# Patient Record
Sex: Female | Born: 1935 | Race: White | Hispanic: No | Smoking: Never smoker
Health system: Southern US, Community
[De-identification: ages and names within clinical notes are randomized; demographics above are authoritative.]

## PROBLEM LIST (undated history)

## (undated) DIAGNOSIS — G510 Bell's palsy: Secondary | ICD-10-CM

## (undated) DIAGNOSIS — H3530 Unspecified macular degeneration: Secondary | ICD-10-CM

## (undated) DIAGNOSIS — G629 Polyneuropathy, unspecified: Secondary | ICD-10-CM

## (undated) DIAGNOSIS — F419 Anxiety disorder, unspecified: Secondary | ICD-10-CM

## (undated) DIAGNOSIS — Z9622 Myringotomy tube(s) status: Secondary | ICD-10-CM

## (undated) DIAGNOSIS — E785 Hyperlipidemia, unspecified: Secondary | ICD-10-CM

## (undated) DIAGNOSIS — I10 Essential (primary) hypertension: Secondary | ICD-10-CM

## (undated) DIAGNOSIS — C4359 Malignant melanoma of other part of trunk: Secondary | ICD-10-CM

## (undated) DIAGNOSIS — J449 Chronic obstructive pulmonary disease, unspecified: Secondary | ICD-10-CM

## (undated) HISTORY — PX: CORNEAL TRANSPLANT: SHX108

## (undated) HISTORY — DX: Malignant melanoma of other part of trunk: C43.59

## (undated) HISTORY — DX: Unspecified macular degeneration: H35.30

## (undated) HISTORY — DX: Myringotomy tube(s) status: Z96.22

## (undated) HISTORY — DX: Essential (primary) hypertension: I10

## (undated) HISTORY — PX: APPENDECTOMY: SHX54

## (undated) HISTORY — DX: Polyneuropathy, unspecified: G62.9

## (undated) HISTORY — DX: Chronic obstructive pulmonary disease, unspecified: J44.9

## (undated) HISTORY — PX: MYRINGOTOMY: SUR874

## (undated) HISTORY — PX: LUMBAR DISC SURGERY: SHX700

## (undated) HISTORY — DX: Hyperlipidemia, unspecified: E78.5

## (undated) HISTORY — DX: Anxiety disorder, unspecified: F41.9

## (undated) HISTORY — PX: TOTAL KNEE ARTHROPLASTY: SHX125

## (undated) HISTORY — DX: Bell's palsy: G51.0

---

## 2004-07-18 ENCOUNTER — Ambulatory Visit: Payer: Self-pay | Admitting: Family Medicine

## 2004-08-01 ENCOUNTER — Ambulatory Visit: Payer: Self-pay | Admitting: Family Medicine

## 2004-08-08 ENCOUNTER — Ambulatory Visit: Payer: Self-pay | Admitting: Family Medicine

## 2004-08-15 ENCOUNTER — Ambulatory Visit: Payer: Self-pay | Admitting: Family Medicine

## 2004-08-27 ENCOUNTER — Emergency Department: Payer: Self-pay | Admitting: Emergency Medicine

## 2004-08-29 ENCOUNTER — Ambulatory Visit: Payer: Self-pay | Admitting: Family Medicine

## 2004-09-11 ENCOUNTER — Ambulatory Visit: Payer: Self-pay | Admitting: Family Medicine

## 2004-10-03 ENCOUNTER — Ambulatory Visit: Payer: Self-pay | Admitting: Family Medicine

## 2005-01-09 ENCOUNTER — Ambulatory Visit: Payer: Self-pay | Admitting: Family Medicine

## 2005-06-05 ENCOUNTER — Ambulatory Visit: Payer: Self-pay | Admitting: Family Medicine

## 2005-12-03 ENCOUNTER — Ambulatory Visit: Payer: Self-pay | Admitting: Family Medicine

## 2006-07-08 ENCOUNTER — Ambulatory Visit: Payer: Self-pay | Admitting: Family Medicine

## 2006-07-08 LAB — CONVERTED CEMR LAB
Basophils Relative: 0.3 % (ref 0.0–1.0)
Direct LDL: 169.1 mg/dL
Eosinophils Relative: 1.7 % (ref 0.0–5.0)
Folate: >20 ng/mL
HDL: 44.9 mg/dL (ref >39.0)
Hgb A1c MFr Bld: 5.9 % (ref 4.6–6.0)
Lymphocytes Relative: 24.7 % (ref 12.0–46.0)
Platelets: 368 10*3/uL (ref 150–400)
RBC: 3.91 M/uL (ref 3.87–5.11)
RDW: 12.8 % (ref 11.5–14.6)
TSH: 2.84 microintl units/mL (ref 0.35–5.50)
Total CHOL/HDL Ratio: 5.4
Triglycerides: 170 mg/dL — ABNORMAL HIGH (ref 0–149)
VLDL: 34 mg/dL (ref 0–40)
Vitamin B-12: 295 pg/mL (ref 211–911)
WBC: 7.8 10*3/uL (ref 4.5–10.5)

## 2006-08-10 ENCOUNTER — Ambulatory Visit: Payer: Self-pay | Admitting: Family Medicine

## 2006-10-11 ENCOUNTER — Ambulatory Visit: Payer: Self-pay | Admitting: Family Medicine

## 2006-10-11 DIAGNOSIS — E119 Type 2 diabetes mellitus without complications: Secondary | ICD-10-CM | POA: Insufficient documentation

## 2006-10-12 LAB — CONVERTED CEMR LAB
ALT: 11 units/L (ref 0–40)
Hgb A1c MFr Bld: 6 % (ref 4.6–6.0)
VLDL: 27 mg/dL (ref 0–40)

## 2007-06-16 ENCOUNTER — Ambulatory Visit: Payer: Self-pay | Admitting: Family Medicine

## 2007-06-16 DIAGNOSIS — D1739 Benign lipomatous neoplasm of skin and subcutaneous tissue of other sites: Secondary | ICD-10-CM

## 2007-06-16 LAB — CONVERTED CEMR LAB
Albumin: 4 g/dL (ref 3.5–5.2)
Alkaline Phosphatase: 58 units/L (ref 39–117)
BUN: 22 mg/dL (ref 6–23)
Basophils Absolute: 0.1 10*3/uL (ref 0.0–0.1)
CO2: 26 meq/L (ref 19–32)
Calcium: 9.6 mg/dL (ref 8.4–10.5)
Creatinine, Ser: 1.3 mg/dL — ABNORMAL HIGH (ref 0.4–1.2)
Eosinophils Relative: 1.7 % (ref 0.0–5.0)
Folate: 14.5 ng/mL
Glucose, Bld: 119 mg/dL — ABNORMAL HIGH (ref 70–99)
HCT: 36.1 % (ref 36.0–46.0)
Hgb A1c MFr Bld: 6.1 % — ABNORMAL HIGH (ref 4.6–6.0)
MCHC: 34.5 g/dL (ref 30.0–36.0)
Neutrophils Relative %: 59.7 % (ref 43.0–77.0)
Platelets: 344 10*3/uL (ref 150–400)
Potassium: 5 meq/L (ref 3.5–5.1)
RBC: 4.05 M/uL (ref 3.87–5.11)
RDW: 12.4 % (ref 11.5–14.6)
TSH: 2.37 microintl units/mL (ref 0.35–5.50)
Total Bilirubin: 0.8 mg/dL (ref 0.3–1.2)
WBC: 6.6 10*3/uL (ref 4.5–10.5)

## 2007-06-20 ENCOUNTER — Encounter (INDEPENDENT_AMBULATORY_CARE_PROVIDER_SITE_OTHER): Payer: Self-pay | Admitting: *Deleted

## 2007-06-20 LAB — CONVERTED CEMR LAB: Vit D, 1,25-Dihydroxy: 17 — ABNORMAL LOW (ref 30–89)

## 2007-07-06 ENCOUNTER — Telehealth: Payer: Self-pay | Admitting: Family Medicine

## 2007-07-13 DIAGNOSIS — J4489 Other specified chronic obstructive pulmonary disease: Secondary | ICD-10-CM | POA: Insufficient documentation

## 2007-07-13 DIAGNOSIS — N3946 Mixed incontinence: Secondary | ICD-10-CM | POA: Insufficient documentation

## 2007-07-13 DIAGNOSIS — G589 Mononeuropathy, unspecified: Secondary | ICD-10-CM | POA: Insufficient documentation

## 2007-07-13 DIAGNOSIS — N318 Other neuromuscular dysfunction of bladder: Secondary | ICD-10-CM | POA: Insufficient documentation

## 2007-07-13 DIAGNOSIS — M1610 Unilateral primary osteoarthritis, unspecified hip: Secondary | ICD-10-CM | POA: Insufficient documentation

## 2007-07-13 DIAGNOSIS — I10 Essential (primary) hypertension: Secondary | ICD-10-CM | POA: Insufficient documentation

## 2007-07-13 DIAGNOSIS — J449 Chronic obstructive pulmonary disease, unspecified: Secondary | ICD-10-CM

## 2007-07-13 DIAGNOSIS — M169 Osteoarthritis of hip, unspecified: Secondary | ICD-10-CM | POA: Insufficient documentation

## 2007-07-13 DIAGNOSIS — R011 Cardiac murmur, unspecified: Secondary | ICD-10-CM

## 2007-07-13 DIAGNOSIS — C439 Malignant melanoma of skin, unspecified: Secondary | ICD-10-CM | POA: Insufficient documentation

## 2007-07-13 DIAGNOSIS — H9190 Unspecified hearing loss, unspecified ear: Secondary | ICD-10-CM | POA: Insufficient documentation

## 2007-07-13 DIAGNOSIS — F411 Generalized anxiety disorder: Secondary | ICD-10-CM | POA: Insufficient documentation

## 2007-07-13 DIAGNOSIS — E785 Hyperlipidemia, unspecified: Secondary | ICD-10-CM

## 2007-07-18 ENCOUNTER — Ambulatory Visit: Payer: Self-pay | Admitting: Family Medicine

## 2007-07-18 DIAGNOSIS — E559 Vitamin D deficiency, unspecified: Secondary | ICD-10-CM | POA: Insufficient documentation

## 2007-08-02 ENCOUNTER — Ambulatory Visit: Payer: Self-pay | Admitting: Family Medicine

## 2007-08-05 LAB — CONVERTED CEMR LAB: Vit D, 1,25-Dihydroxy: 25 — ABNORMAL LOW (ref 30–89)

## 2007-09-19 ENCOUNTER — Ambulatory Visit: Payer: Self-pay | Admitting: Family Medicine

## 2007-09-20 LAB — CONVERTED CEMR LAB: Hgb A1c MFr Bld: 6 % (ref 4.6–6.0)

## 2007-09-21 LAB — CONVERTED CEMR LAB: Vit D, 1,25-Dihydroxy: 37 (ref 30–89)

## 2007-11-23 ENCOUNTER — Ambulatory Visit: Payer: Self-pay | Admitting: Family Medicine

## 2007-11-30 ENCOUNTER — Encounter (INDEPENDENT_AMBULATORY_CARE_PROVIDER_SITE_OTHER): Payer: Self-pay | Admitting: *Deleted

## 2008-01-03 ENCOUNTER — Ambulatory Visit: Payer: Self-pay | Admitting: Family Medicine

## 2008-02-14 ENCOUNTER — Ambulatory Visit: Payer: Self-pay | Admitting: Family Medicine

## 2008-02-14 DIAGNOSIS — J310 Chronic rhinitis: Secondary | ICD-10-CM | POA: Insufficient documentation

## 2008-02-28 ENCOUNTER — Ambulatory Visit: Payer: Self-pay | Admitting: Family Medicine

## 2008-02-28 DIAGNOSIS — H6980 Other specified disorders of Eustachian tube, unspecified ear: Secondary | ICD-10-CM

## 2008-06-06 ENCOUNTER — Ambulatory Visit: Payer: Self-pay | Admitting: Family Medicine

## 2008-06-06 DIAGNOSIS — G5600 Carpal tunnel syndrome, unspecified upper limb: Secondary | ICD-10-CM

## 2008-06-08 LAB — CONVERTED CEMR LAB
Albumin: 4 g/dL (ref 3.5–5.2)
CO2: 26 meq/L (ref 19–32)
Calcium: 9.6 mg/dL (ref 8.4–10.5)
Cholesterol: 191 mg/dL (ref 0–200)
Creatinine, Ser: 1.4 mg/dL — ABNORMAL HIGH (ref 0.4–1.2)
Folate: >20 ng/mL
GFR calc Af Amer: 46 mL/min
GFR calc non Af Amer: 38 mL/min
LDL Cholesterol: 128 mg/dL — ABNORMAL HIGH (ref 0–99)
Sodium: 140 meq/L (ref 135–145)
Total CHOL/HDL Ratio: 4.1
VLDL: 17 mg/dL (ref 0–40)
Vit D, 1,25-Dihydroxy: 39 (ref 30–89)
Vitamin B-12: 274 pg/mL (ref 211–911)

## 2008-06-26 ENCOUNTER — Ambulatory Visit: Payer: Self-pay | Admitting: Family Medicine

## 2008-07-04 ENCOUNTER — Ambulatory Visit: Payer: Self-pay | Admitting: Family Medicine

## 2008-07-05 LAB — CONVERTED CEMR LAB
Albumin: 3.7 g/dL (ref 3.5–5.2)
CO2: 28 meq/L (ref 19–32)
Chloride: 109 meq/L (ref 96–112)
Creatinine, Ser: 1.1 mg/dL (ref 0.4–1.2)
GFR calc Af Amer: 60 mL/min
GFR calc non Af Amer: 50 mL/min
Glucose, Bld: 105 mg/dL — ABNORMAL HIGH (ref 70–99)

## 2008-07-09 ENCOUNTER — Ambulatory Visit: Payer: Self-pay | Admitting: Family Medicine

## 2008-07-24 ENCOUNTER — Telehealth: Payer: Self-pay | Admitting: Family Medicine

## 2008-07-29 ENCOUNTER — Telehealth: Payer: Self-pay | Admitting: Family Medicine

## 2008-08-03 ENCOUNTER — Telehealth (INDEPENDENT_AMBULATORY_CARE_PROVIDER_SITE_OTHER): Payer: Self-pay | Admitting: *Deleted

## 2008-08-08 ENCOUNTER — Ambulatory Visit: Payer: Self-pay | Admitting: Family Medicine

## 2008-08-22 ENCOUNTER — Telehealth: Payer: Self-pay | Admitting: Family Medicine

## 2008-08-27 ENCOUNTER — Telehealth: Payer: Self-pay | Admitting: Family Medicine

## 2008-09-10 ENCOUNTER — Ambulatory Visit: Payer: Self-pay | Admitting: Family Medicine

## 2008-09-10 LAB — CONVERTED CEMR LAB
HDL: 44.6 mg/dL (ref >39.00)
Total CHOL/HDL Ratio: 4
Triglycerides: 104 mg/dL (ref 0.0–149.0)
VLDL: 20.8 mg/dL (ref 0.0–40.0)

## 2008-09-13 ENCOUNTER — Ambulatory Visit: Payer: Self-pay | Admitting: Family Medicine

## 2008-09-13 LAB — CONVERTED CEMR LAB: Vit D, 25-Hydroxy: 61 ng/mL (ref 30–89)

## 2009-03-18 ENCOUNTER — Ambulatory Visit: Payer: Self-pay | Admitting: Family Medicine

## 2009-03-20 LAB — CONVERTED CEMR LAB
Albumin: 4.2 g/dL (ref 3.5–5.2)
Calcium: 9 mg/dL (ref 8.4–10.5)
Cholesterol: 189 mg/dL (ref 0–200)
Creatinine, Ser: 1.2 mg/dL (ref 0.4–1.2)
Glucose, Bld: 111 mg/dL — ABNORMAL HIGH (ref 70–99)
HDL: 57.6 mg/dL (ref >39.00)
LDL Cholesterol: 120 mg/dL — ABNORMAL HIGH (ref 0–99)
Phosphorus: 3.7 mg/dL (ref 2.3–4.6)
VLDL: 11 mg/dL (ref 0.0–40.0)

## 2009-06-12 ENCOUNTER — Telehealth: Payer: Self-pay | Admitting: Family Medicine

## 2009-06-12 ENCOUNTER — Emergency Department: Payer: Self-pay | Admitting: Internal Medicine

## 2009-06-18 ENCOUNTER — Ambulatory Visit: Payer: Self-pay | Admitting: Family Medicine

## 2009-06-20 LAB — CONVERTED CEMR LAB
ALT: 18 units/L (ref 0–35)
HDL: 56.7 mg/dL (ref >39.00)
Total CHOL/HDL Ratio: 4
Triglycerides: 111 mg/dL (ref 0.0–149.0)
VLDL: 22.2 mg/dL (ref 0.0–40.0)

## 2009-09-04 ENCOUNTER — Telehealth: Payer: Self-pay | Admitting: Family Medicine

## 2009-09-05 ENCOUNTER — Ambulatory Visit: Payer: Self-pay | Admitting: Family Medicine

## 2009-09-11 ENCOUNTER — Ambulatory Visit: Payer: Self-pay | Admitting: Family Medicine

## 2010-03-10 ENCOUNTER — Ambulatory Visit: Payer: Self-pay | Admitting: Family Medicine

## 2010-03-10 LAB — CONVERTED CEMR LAB
ALT: 18 units/L (ref 0–35)
AST: 24 units/L (ref 0–37)
BUN: 27 mg/dL — ABNORMAL HIGH (ref 6–23)
Basophils Absolute: 0 10*3/uL (ref 0.0–0.1)
CO2: 27 meq/L (ref 19–32)
Creatinine, Ser: 1 mg/dL (ref 0.4–1.2)
GFR calc non Af Amer: 55.17 mL/min (ref >60)
Glucose, Bld: 101 mg/dL — ABNORMAL HIGH (ref 70–99)
HCT: 33.2 % — ABNORMAL LOW (ref 36.0–46.0)
LDL Cholesterol: 109 mg/dL — ABNORMAL HIGH (ref 0–99)
Lymphs Abs: 1.6 10*3/uL (ref 0.7–4.0)
Monocytes Absolute: 0.7 10*3/uL (ref 0.1–1.0)
Monocytes Relative: 9.3 % (ref 3.0–12.0)
Phosphorus: 3.8 mg/dL (ref 2.3–4.6)
Platelets: 332 10*3/uL (ref 150.0–400.0)
RDW: 15.1 % — ABNORMAL HIGH (ref 11.5–14.6)
TSH: 2.68 microintl units/mL (ref 0.35–5.50)
Total CHOL/HDL Ratio: 4
VLDL: 34 mg/dL (ref 0.0–40.0)

## 2010-03-17 ENCOUNTER — Ambulatory Visit: Payer: Self-pay | Admitting: Family Medicine

## 2010-03-17 DIAGNOSIS — D649 Anemia, unspecified: Secondary | ICD-10-CM | POA: Insufficient documentation

## 2010-04-18 ENCOUNTER — Ambulatory Visit: Payer: Self-pay | Admitting: Family Medicine

## 2010-04-18 ENCOUNTER — Telehealth: Payer: Self-pay | Admitting: Family Medicine

## 2010-04-18 DIAGNOSIS — H60399 Other infective otitis externa, unspecified ear: Secondary | ICD-10-CM | POA: Insufficient documentation

## 2010-07-01 NOTE — Progress Notes (Signed)
Summary: Dizzy, lightheaded, not eating much  Phone Note Call from Patient Call back at Home Phone [phone]   Caller: Daughter/Joyce Ferd Glassing Call For: Judith Part MD Summary of Call: Daughter feels like her mom is getting sick.  She has a bad cough, complaining of being tired all the time.  Today she is really dizzy and having a hard time keeping her balance.  Some light-headedness  No eating and daughter is very concerned because she loves to eat, just tasting the food and then throwing it out, doesn't look good.  Sleeping a lot, not wanting to do much.  Please advise Initial call taken by: Linde Gillis CMA Duncan Dull),  September 04, 2009 10:07 AM  Follow-up for Phone Call        sounds like she is acutely ill with something - if no appts avail - I recommend going to urgent care today Follow-up by: Judith Part MD,  September 04, 2009 10:28 AM  Additional Follow-up for Phone Call Additional follow up Details #1::        Patient's daughter, Lurena Joiner notified as instructed by telephone. Alona Bene said thinks sinus infection and pt isnot vomiting. Wants pt seen here tomorrow. Dr Milinda Antis said OK if someone has available appt on 09/05/09 OK to make appt but if pt condition worsens go to Urgent care. Pt scheduled with Dr Dayton Martes 09/05/09 at 3:15pm.Rena Isley LPN  September 04, 9145 11:00 AM

## 2010-07-01 NOTE — Assessment & Plan Note (Signed)
Summary: SINUS,COUGH,DIZZY PER DR TOWER/RI   Vital Signs:  Patient profile:   77 year old female Height:      60 inches Weight:      165.50 pounds Temp:     98.7 degrees F oral Pulse rate:   84 / minute Pulse rhythm:   regular BP sitting:   146 / 58  (left arm) Cuff size:   large  Vitals Entered By: Delilah Shan CMA Duncan Dull) (September 05, 2009 3:07 PM) CC: Sinus probs, coughing, feels very dizzy, nauseated in the a.m.   History of Present Illness: 77 yo with over two weeks of sinus pressure.  Feels like its getting worse. Started with runny nose, watery eyes. Taking OTC allery medication and FLonase but symptoms keep worsening. Last few morning, so much drainage, she felt nauseated, no vomiting. No diarrhea. No fevers but does feel cold. Harder time hearing, no ear pain. Cough is non productive. No SOB or CP.  Current Medications (verified): 1)  Neurontin 300 Mg Caps (Gabapentin) .... One By Mouth Daily As Needed 2)  Aspirin 325 Mg  Tabs (Aspirin) .... Take One By Mouth Once A Day 3)  Refresh Dry Eye Therapy 1-1 %  Soln (Glycerin-Polysorbate 80) .... As Needed 4)  Nitroquick 0.4 Mg  Subl (Nitroglycerin) .... As Needed 5)  Flonase 50 Mcg/act  Susp (Fluticasone Propionate) .... 2 Sprays in Each Nostril Once Daily 6)  Vitamin D3 Drops .Marland Kitchen.. 3 Drops Once Daily ( About 1000-1200 International Units Daily) 7)  Ocuvite  Tabs (Multiple Vitamins-Minerals) .... Two Tabs in Am and Two Tabs in Pm 8)  B Complex Drops- Equiv To B12 1200 Mcg .Marland KitchenMarland Kitchen. 1 Dose Daily 9)  Zyrtec Allergy 10 Mg Tabs (Cetirizine Hcl) 10)  Multivitamins   Tabs (Multiple Vitamin) .... One Daily 11)  Hydrochlorothiazide 25 Mg Tabs (Hydrochlorothiazide) .... One Daily in Morning 12)  Vitamin D3 2000 Unit Caps (Cholecalciferol) .... Two Daily 13)  Amoxicillin 500 Mg Tabs (Amoxicillin) .Marland Kitchen.. 1 Tab By Mouth Two Times A Day X 10 Days  Allergies: 1)  ! * Lisinopril 2)  ! * Hctz 3)  * Nsaids Group 4)  Celebrex  (Celecoxib)  Review of Systems      See HPI General:  Complains of chills and fever. ENT:  Complains of nasal congestion, sinus pressure, and sore throat. CV:  Denies chest pain or discomfort. Resp:  Complains of cough; denies shortness of breath, sputum productive, and wheezing.  Physical Exam  General:  overwt elderly female  Nose:  nares boggy, erythematous. right> left. maxillary sinuses TTP bilaterally Mouth:  pharynx pink and moist.   Lungs:  Normal respiratory effort, chest expands symmetrically. Lungs are clear to auscultation, no crackles or wheezes. Heart:  RRR  Extremities:  trace left pedal edema and trace right pedal edema.   Psych:  very pleasant   Impression & Recommendations:  Problem # 1:  OTHER ACUTE SINUSITIS (ICD-461.8) Assessment New Given duration of symptoms, will treat for bacterial sinusitis with Amoxicillin. Continue Flonase. Her updated medication list for this problem includes:    Flonase 50 Mcg/act Susp (Fluticasone propionate) .Marland Kitchen... 2 sprays in each nostril once daily    Amoxicillin 500 Mg Tabs (Amoxicillin) .Marland Kitchen... 1 tab by mouth two times a day x 10 days  Complete Medication List: 1)  Neurontin 300 Mg Caps (Gabapentin) .... One by mouth daily as needed 2)  Aspirin 325 Mg Tabs (Aspirin) .... Take one by mouth once a day 3)  Refresh Dry  Eye Therapy 1-1 % Soln (Glycerin-polysorbate 80) .... As needed 4)  Nitroquick 0.4 Mg Subl (Nitroglycerin) .... As needed 5)  Flonase 50 Mcg/act Susp (Fluticasone propionate) .... 2 sprays in each nostril once daily 6)  Vitamin D3 Drops  .Marland Kitchen.. 3 drops once daily ( about 1000-1200 international units daily) 7)  Ocuvite Tabs (Multiple vitamins-minerals) .... Two tabs in am and two tabs in pm 8)  B Complex Drops- Equiv To B12 1200 Mcg  .Marland Kitchen.. 1 dose daily 9)  Zyrtec Allergy 10 Mg Tabs (Cetirizine hcl) 10)  Multivitamins Tabs (Multiple vitamin) .... One daily 11)  Hydrochlorothiazide 25 Mg Tabs (Hydrochlorothiazide) ....  One daily in morning 12)  Vitamin D3 2000 Unit Caps (Cholecalciferol) .... Two daily 13)  Amoxicillin 500 Mg Tabs (Amoxicillin) .Marland Kitchen.. 1 tab by mouth two times a day x 10 days Prescriptions: AMOXICILLIN 500 MG TABS (AMOXICILLIN) 1 tab by mouth two times a day x 10 days  #20 x 0   Entered and Authorized by:   Ruthe Mannan MD   Signed by:   Ruthe Mannan MD on 09/05/2009   Method used:   Electronically to        [facility]. [address] [phone]* (retail)       [address] [place], [place]  [id number]       Ph: [phone]       Fax: [phone]   RxID:   [id number]   Current Allergies (reviewed today): ! * LISINOPRIL ! * HCTZ * NSAIDS GROUP CELEBREX (CELECOXIB)

## 2010-07-01 NOTE — Progress Notes (Signed)
Summary: cipro too expensive   Phone Note Call from Patient Call back at Home Phone [phone]   Caller: Patient Call For: Judith Part MD Summary of Call: Patient's grand daughter says that when they went to pick up rx for the cipro it was gong to cost her $140.00. She is asking if there is something less expensive that he insurance would cover more fully that can be called in. [facility]ses  [facility] [address]  Initial call taken by: Melody Comas,  April 18, 2010 4:38 PM  Follow-up for Phone Call        can change to cortisporin - but this is not as potent so update if not improving  Follow-up by: Judith Part MD,  April 18, 2010 5:13 PM  Additional Follow-up for Phone Call Additional follow up Details #1::        Patient notified as instructed by telephone. Medication phoned to pharmacy as instructed. Lewanda Rife LPN  April 18, 2010 5:41 PM

## 2010-07-01 NOTE — Progress Notes (Signed)
Summary: pt fell  Phone Note Call from Patient   Caller: Daughter Summary of Call: Pt's daughter called this am, said pt had fallen 2 days ago and both eyes are black, she has a knot on her head and looks worse today than she did after the fall.  Per Dr. Milinda Antis I advised that pt needs to go to ER for possible CT.  Daughter agreed. Initial call taken by: Lowella Petties CMA,  June 12, 2009 12:34 PM

## 2010-07-01 NOTE — Assessment & Plan Note (Signed)
Summary: 6 MONTH FOLLOW UP/[facility]   Vital Signs:  Patient profile:   77 year old female Height:      60 inches Weight:      166.75 pounds BMI:     32.68 Temp:     98.2 degrees F oral Pulse rate:   84 / minute Pulse rhythm:   regular BP sitting:   124 / 70  (left arm) Cuff size:   large  Vitals Entered By: Lewanda Rife LPN (March 17, 2010 9:30 AM) CC: six month f/u   History of Present Illness: here for f/u of HTN and lipids and chronic med problems   b12 level is normal  slt anemia with hb of 11.4-- she does not get much iron in diet  last colonosc was in 2000- and does not want another one -- at her age   wants flu shot today- got it today   lipids are improved with trig 170 and HDL 43 and LDL 109-- great ! is trying to eat healthier diet  eats oatmeal and all bran and loves vegetables    HTN is well controlled at 124/60  a lot of neuropathy symptoms in feet at night  does not want to go up on dose of gabapentin, however -- thinks she can live with it   Allergies: 1)  ! * Lisinopril 2)  ! * Hctz 3)  * Nsaids Group 4)  Celebrex (Celecoxib)  Past History:  Past Medical History: Last updated: 06/26/2008 macular degeneration Bell's palsy Allergic rhinitis Anxiety COPD Hyperlipidemia Hypertension neuropathy vit D deficiency ETD-- with myringotomy tubes   ENT -- Dr Catalina AntiguaAshley Royalty   Past Surgical History: Last updated: 02/28/2008 Total knee replacement x 2 LS discectomy Appendectomy Melanoma- back Corneal transplant Dexa (1999) Colonoscopy- neg w/ polyp (2000) Hysterectomy myringotomy tube R ear Bell's palsy (1998) 2D Echo EF 55%, normal (07/2004)  Family History: Last updated: 09/19/2007 Father: ? bone cancer Mother: ? heart problems, HTN Siblings:  daugter breast cancer  Social History: Last updated: 02/28/2008 Marital Status:widowed Children: 5- 1 daughter died of breast cancer Occupation: retired non smoker  Risk  Factors: Smoking Status: never (07/13/2007)  Review of Systems General:  Complains of fatigue; denies fever, loss of appetite, and malaise. Eyes:  Denies blurring and eye irritation. CV:  Denies chest pain or discomfort, lightheadness, and palpitations. Resp:  Denies cough, shortness of breath, and wheezing. GI:  Denies abdominal pain, change in bowel habits, and nausea. MS:  Complains of stiffness; denies joint pain, muscle aches, and cramps. Derm:  Denies lesion(s), poor wound healing, and rash. Neuro:  Denies numbness and tingling. Psych:  Denies anxiety and depression. Endo:  Denies cold intolerance, excessive thirst, excessive urination, and heat intolerance. Heme:  Denies abnormal bruising and bleeding.  Physical Exam  General:  overwt elderly female  Head:  normocephalic, atraumatic, and no abnormalities observed. no sinus tenderness today Eyes:  pupils equal, pupils round, and pupils reactive to light.  baseline poor vision from mac degeneration Mouth:  pharynx pink and moist.   Neck:  supple with full rom and no masses or thyromegally, no JVD or carotid bruit  Lungs:  Normal respiratory effort, chest expands symmetrically. Lungs are clear to auscultation, no crackles or wheezes. Heart:  RRR, M stable, no gallops Abdomen:  Bowel sounds positive,abdomen soft and non-tender without masses, organomegaly or hernias noted. no renal bruits  Msk:  No deformity or scoliosis noted of thoracic or lumbar spine.   Pulses:  R  and L carotid,radial,femoral,dorsalis pedis and posterior tibial pulses are full and equal bilaterally Extremities:  No clubbing, cyanosis, edema, or deformity noted with normal full range of motion of all joints.   Neurologic:  sensation intact to light touch, gait normal, and DTRs symmetrical and normal.   Skin:  Intact without suspicious lesions or rashes no pallor or jaundice Cervical Nodes:  No lymphadenopathy noted Inguinal Nodes:  No significant  adenopathy Psych:  normal affect, talkative and pleasant   Diabetes Management Exam:    Foot Exam (with socks and/or shoes not present):       Sensory-Pinprick/Light touch:          Left medial foot (L-4): normal          Left dorsal foot (L-5): normal          Left lateral foot (S-1): normal          Right medial foot (L-4): normal          Right dorsal foot (L-5): normal          Right lateral foot (S-1): normal       Sensory-Monofilament:          Left foot: normal          Right foot: normal       Inspection:          Left foot: normal          Right foot: normal       Nails:          Left foot: normal          Right foot: normal   Impression & Recommendations:  Problem # 1:  NEUROPATHY (ICD-355.9) Assessment Deteriorated per pt this is worse but does not want further eval or inc in gabapentin  will keep me updated nl foot exam today  Problem # 2:  HYPERTENSION (ICD-401.9) Assessment: Unchanged  continues to bet well controlled- without change lab and f/u in 6 months  Her updated medication list for this problem includes:    Hydrochlorothiazide 25 Mg Tabs (Hydrochlorothiazide) ..... One daily in morning  BP today: 124/70 Prior BP: 128/60 (09/11/2009)  Labs Reviewed: K+: 5.1 (03/10/2010) Creat: : 1.0 (03/10/2010)   Chol: 187 (03/10/2010)   HDL: 43.90 (03/10/2010)   LDL: 109 (03/10/2010)   TG: 170.0 (03/10/2010)  Problem # 3:  HYPERLIPIDEMIA (ICD-272.4) Assessment: Deteriorated  this is much improved with better diet (has someone cooking for her ) -- will continue that labs reviewed  lab and f/u 6 mo  Labs Reviewed: SGOT: 24 (03/10/2010)   SGPT: 18 (03/10/2010)   HDL:43.90 (03/10/2010), 56.70 (06/18/2009)  LDL:109 (03/10/2010), 120 (03/18/2009)  Chol:187 (03/10/2010), 206 (06/18/2009)  Trig:170.0 (03/10/2010), 111.0 (06/18/2009)  Problem # 4:  ANEMIA (ICD-285.9) Assessment: New likely anemia of chronic dz no gi symptoms and pt declines further w/u  asked  her to start mvi with iron daily  re check at the  70mo visit -- incl some iron studies B12 is good  Complete Medication List: 1)  Neurontin 300 Mg Caps (Gabapentin) .... One by mouth daily as needed 2)  Aspirin 325 Mg Tabs (Aspirin) .... Take one by mouth once a day as needed 3)  Refresh Dry Eye Therapy 1-1 % Soln (Glycerin-polysorbate 80) .... As needed 4)  Nitroquick 0.4 Mg Subl (Nitroglycerin) .... As needed 5)  Flonase 50 Mcg/act Susp (Fluticasone propionate) .... 2 sprays in each nostril once daily 6)  Vitamin D3 Drops  .Marland KitchenMarland KitchenMarland Kitchen  3 drops once daily ( about 1000-1200 international units daily) 7)  Ocuvite Tabs (Multiple vitamins-minerals) .... Two tabs in am and two tabs in pm 8)  B Complex Drops- Equiv To B12 1200 Mcg  .Marland Kitchen.. 1 dose daily 9)  Zyrtec Allergy 10 Mg Tabs (Cetirizine hcl) 10)  Multivitamins Tabs (Multiple vitamin) .... One daily 11)  Hydrochlorothiazide 25 Mg Tabs (Hydrochlorothiazide) .... One daily in morning 12)  Vitamin D3 2000 Unit Caps (Cholecalciferol) .... One  daily 13)  Vitamin B 12 ?mg  .... Take 1 tablet by mouth once a day  Other Orders: Flu Vaccine 25yrs + MEDICARE PATIENTS (Q4696) Administration Flu vaccine - MCR (E9528)  Patient Instructions: 1)  get a multivitamin with iron and take it every day (even a childrens' vitamin is fine)  2)  cholesterol is better - keep up the good diet  3)  blood pressure is good tooo  4)  schedule fasting labs and then follow up in 6 months -- renal / lipid/ast/alt / cbc with diff / iron level/ ferritin 401.1, 272, and 285.9   Orders Added: 1)  Flu Vaccine 33yrs + MEDICARE PATIENTS [Q2039] 2)  Administration Flu vaccine - MCR [G0008] 3)  Est. Patient Level IV [41324]    Current Allergies (reviewed today): ! * LISINOPRIL ! * HCTZ * NSAIDS GROUP CELEBREX (CELECOXIB)   Flu Vaccine Consent Questions     Do you have a history of severe allergic reactions to this vaccine? no    Any prior history of allergic reactions to  egg and/or gelatin? no    Do you have a sensitivity to the preservative Thimersol? no    Do you have a past history of Guillan-Barre Syndrome? no    Do you currently have an acute febrile illness? no    Have you ever had a severe reaction to latex? no    Vaccine information given and explained to patient? yes    Are you currently pregnant? no    Lot Number:[id number]   Exp Date:11/29/2010   Site Given  Left Deltoid [email or web address] Lewanda Rife LPN  March 17, 2010 9:35 AM

## 2010-07-01 NOTE — Assessment & Plan Note (Signed)
Summary: ROA   CYD  r/s from 09/16/09   Vital Signs:  Patient profile:   77 year old female Height:      60 inches Weight:      164.25 pounds Temp:     97.9 degrees F oral Pulse rate:   76 / minute Pulse rhythm:   regular BP sitting:   128 / 60  (left arm) Cuff size:   large  Vitals Entered By: Lewanda Rife LPN (September 11, 2009 8:59 AM) CC: six month f/u   History of Present Illness: here for f/u of HTN and lipids and hyperglycemia  is feeling about the same   recent sinus infection - is getting better   wt is down 1 lb-- eating a little healthier   bp is stable - no problems 128/60 no  headaches  lipids up in [place] with trig 111, HDL good at 56 and LDL 146 (up from 120) with diet control is eating a little better now  not much appetite in general   AIC is 6.0- fairly stable  no symptoms  does stay away from sugar   eyes -- mac degeneration is getting worse  is hard to go out anymore  last opthy -- in oct with Dr Ashley Royalty     Allergies: 1)  ! * Lisinopril 2)  ! * Hctz 3)  * Nsaids Group 4)  Celebrex (Celecoxib)  Past History:  Past Medical History: Last updated: 06/26/2008 macular degeneration Bell's palsy Allergic rhinitis Anxiety COPD Hyperlipidemia Hypertension neuropathy vit D deficiency ETD-- with myringotomy tubes   ENT -- Dr Catalina AntiguaAshley Royalty   Past Surgical History: Last updated: 02/28/2008 Total knee replacement x 2 LS discectomy Appendectomy Melanoma- back Corneal transplant Dexa (1999) Colonoscopy- neg w/ polyp (2000) Hysterectomy myringotomy tube R ear Bell's palsy (1998) 2D Echo EF 55%, normal (07/2004)  Family History: Last updated: 09/19/2007 Father: ? bone cancer Mother: ? heart problems, HTN Siblings:  daugter breast cancer  Social History: Last updated: 02/28/2008 Marital Status:widowed Children: 5- 1 daughter died of breast cancer Occupation: retired non smoker  Risk Factors: Smoking Status: never  (07/13/2007)  Review of Systems General:  Complains of fatigue; denies loss of appetite and malaise. Eyes:  Complains of blurring; denies eye pain. CV:  Denies chest pain or discomfort, lightheadness, palpitations, and shortness of breath with exertion. Resp:  Denies cough and wheezing. GI:  Denies abdominal pain, bloody stools, change in bowel habits, and nausea. MS:  Denies joint pain, joint redness, and joint swelling. Derm:  Denies lesion(s), poor wound healing, and rash. Neuro:  Complains of numbness and tingling. Psych:  Complains of anxiety; denies depression, panic attacks, and sense of great danger. Endo:  Denies excessive thirst and excessive urination. Heme:  Denies abnormal bruising and bleeding.  Physical Exam  General:  overwt elderly female  Head:  normocephalic, atraumatic, and no abnormalities observed. no sinus tenderness today Eyes:  pupils equal, pupils round, and pupils reactive to light.  baseline poor vision from mac degeneration Ears:  R ear normal and L ear normal.   is HOH -- with hearing aides  Nose:  clear rhinorrhea- pale mucosa in nares  Mouth:  pharynx pink and moist.   Neck:  supple with full rom and no masses or thyromegally, no JVD or carotid bruit  Chest Wall:  No deformities, masses, or tenderness noted. Lungs:  Normal respiratory effort, chest expands symmetrically. Lungs are clear to auscultation, no crackles or wheezes. Heart:  RRR  Abdomen:  Bowel sounds positive,abdomen soft and non-tender without masses, organomegaly or hernias noted. no renal bruits  Msk:  No deformity or scoliosis noted of thoracic or lumbar spine.   baseline poor rom knees and slow gait  Pulses:  R and L carotid,radial,femoral,dorsalis pedis and posterior tibial pulses are full and equal bilaterally Extremities:  trace left pedal edema and trace right pedal edema.   Neurologic:  despite neuropathy- can feel monofil Skin:  Intact without suspicious lesions or rashes fair  complexion without pallor  Cervical Nodes:  No lymphadenopathy noted Inguinal Nodes:  No significant adenopathy Psych:  nl affect -mildly anx as usual   Diabetes Management Exam:    Foot Exam (with socks and/or shoes not present):       Sensory-Pinprick/Light touch:          Left medial foot (L-4): normal          Left dorsal foot (L-5): normal          Left lateral foot (S-1): normal          Right medial foot (L-4): normal          Right dorsal foot (L-5): normal          Right lateral foot (S-1): normal       Sensory-Monofilament:          Left foot: normal          Right foot: normal       Sensory-other: despite neuropathy- can still feel monofilament        Inspection:          Left foot: normal          Right foot: normal       Nails:          Left foot: normal          Right foot: normal    Eye Exam:       Eye Exam done elsewhere          Date: 03/01/2009          Results: normal          Done by: Dr Ashley Royalty    Impression & Recommendations:  Problem # 1:  HYPERTENSION (ICD-401.9) Assessment Unchanged  bp is very well controlled with hct  rev last labs with pt  rev low sodium diet  lab and f/u 6 mo  refilled med  Her updated medication list for this problem includes:    Hydrochlorothiazide 25 Mg Tabs (Hydrochlorothiazide) ..... One daily in morning  BP today: 128/60 Prior BP: 146/58 (09/05/2009)  Labs Reviewed: K+: 4.2 (03/18/2009) Creat: : 1.2 (03/18/2009)   Chol: 206 (06/18/2009)   HDL: 56.70 (06/18/2009)   LDL: 120 (03/18/2009)   TG: 111.0 (06/18/2009)  Orders: Prescription Created Electronically [phone])  Problem # 2:  HYPERLIPIDEMIA (ICD-272.4) Assessment: Deteriorated  cholesterol is up - reviewed her numbers rev low sat fat diet - will try to avoid red meat/ fried foods in general disc goals for LDL  at her age - unlikely to start chol med lab and f/u in 6 mo planned   Labs Reviewed: SGOT: 30 (06/18/2009)   SGPT: 18 (06/18/2009)    HDL:56.70 (06/18/2009), 57.60 (03/18/2009)  LDL:120 (03/18/2009), 99 (60/45/4098)  Chol:206 (06/18/2009), 189 (03/18/2009)  Trig:111.0 (06/18/2009), 55.0 (03/18/2009)  Orders: Prescription Created Electronically [phone])  Problem # 3:  DIABETES MELLITUS, TYPE II (ICD-250.00) Assessment: Unchanged  borderline/ mild with AIC of 6  doubt this  is all the reason for her neuropathy  again rev diet low in simple sugars  plan labs and f/u 6 mo  utd opthy Her updated medication list for this problem includes:    Aspirin 325 Mg Tabs (Aspirin) .Marland Kitchen... Take one by mouth once a day as needed  Labs Reviewed: Creat: 1.2 (03/18/2009)     Last Eye Exam: normal (04/30/2008) Reviewed HgBA1c results: 6.0 (06/18/2009)  5.9 (03/18/2009)  Orders: Prescription Created Electronically [phone])  Problem # 4:  NEUROPATHY (ICD-355.9) Assessment: Deteriorated this worsens with tme but gabapentin helps  will re check B12 level with next labs enc to continue her vit  d also   Problem # 5:  CHRONIC RHINITIS (ICD-472.0) Assessment: Deteriorated  sinusitis recent is improved - still a little dizzy adv to update me if this persists beyond 2 weeks or worsens/ or any headache  enc to continue flonase for allergy season  Orders: Prescription Created Electronically [phone])  Complete Medication List: 1)  Neurontin 300 Mg Caps (Gabapentin) .... One by mouth daily as needed 2)  Aspirin 325 Mg Tabs (Aspirin) .... Take one by mouth once a day as needed 3)  Refresh Dry Eye Therapy 1-1 % Soln (Glycerin-polysorbate 80) .... As needed 4)  Nitroquick 0.4 Mg Subl (Nitroglycerin) .... As needed 5)  Flonase 50 Mcg/act Susp (Fluticasone propionate) .... 2 sprays in each nostril once daily 6)  Vitamin D3 Drops  .Marland Kitchen.. 3 drops once daily ( about 1000-1200 international units daily) 7)  Ocuvite Tabs (Multiple vitamins-minerals) .... Two tabs in am and two tabs in pm 8)  B Complex Drops- Equiv To B12 1200 Mcg  .Marland Kitchen.. 1 dose  daily 9)  Zyrtec Allergy 10 Mg Tabs (Cetirizine hcl) 10)  Multivitamins Tabs (Multiple vitamin) .... One daily 11)  Hydrochlorothiazide 25 Mg Tabs (Hydrochlorothiazide) .... One daily in morning 12)  Vitamin D3 2000 Unit Caps (Cholecalciferol) .... One  daily 13)  Amoxicillin 500 Mg Tabs (Amoxicillin) .Marland Kitchen.. 1 tab by mouth two times a day x 10 days 14)  Vitamin B 12 ?mg  .... Take 1 tablet by mouth once a day  Patient Instructions: 1)  schedule fasting labs and then follow up in 6 months lipid/ast/alt/renal/cbc with diff/ tsh / b12 level 355.9, 401.1, 272, 250.0  2)  if dizziness does not improve in another few weeks when sinus infection is better let me know  3)  try to avoid fatty foods for your cholesterol - fried foods/ red meat / egg yolks, butter, shellfish  4)  no change in medicines  Prescriptions: HYDROCHLOROTHIAZIDE 25 MG TABS (HYDROCHLOROTHIAZIDE) one daily in morning  #30 x 11   Entered and Authorized by:   Judith Part MD   Signed by:   Judith Part MD on 09/11/2009   Method used:   Electronically to        [facility] [address] [phone]* (retail)       [address] [place], [place]  [id number]       Ph: [phone]       Fax: [phone]   RxID:   [id number] FLONASE 50 MCG/ACT  SUSP (FLUTICASONE PROPIONATE) 2 sprays in each nostril once daily  #1 mdi x 11   Entered and Authorized by:   Judith Part MD   Signed by:   Judith Part MD on 09/11/2009   Method used:   Electronically to        [facility] [address] [phone]* (retail)       [address] [place], [place]  [id number]       Ph: [phone]       Fax: [phone]   RxID:   [id number] NEURONTIN 300 MG CAPS (GABAPENTIN) one by mouth daily as needed  #30 x 11   Entered and Authorized by:   Judith Part MD   Signed by:   Judith Part MD on 09/11/2009   Method used:   Electronically to        [facility] [address] [phone]* (retail)       [address] [place], [place]  [id number]       Ph: [phone]       Fax: [phone]   RxID:   [id number]   Current Allergies (reviewed today): ! * LISINOPRIL ! * HCTZ * NSAIDS GROUP CELEBREX (CELECOXIB)

## 2010-07-01 NOTE — Assessment & Plan Note (Signed)
Summary: ear draining/alc   Vital Signs:  Patient profile:   77 year old female Height:      60 inches Weight:      167.25 pounds BMI:     32.78 Temp:     98.5 degrees F oral Pulse rate:   84 / minute Pulse rhythm:   regular BP sitting:   118 / 66  (left arm) Cuff size:   large  Vitals Entered By: Lewanda Rife LPN (April 18, 2010 3:14 PM) CC: Lt ear draining and hurts slightly. pain level now is 1.   History of Present Illness: here for L ear discomfort and drainage   L ear has been running quite a bit  a lot of water at first then a bit thicker a little yellow  that ear is hurting a little but not bad   also a runny nose and eyes   using zyrtec for allergies   afrin every night    Allergies: 1)  ! * Lisinopril 2)  ! * Hctz 3)  * Nsaids Group 4)  Celebrex (Celecoxib)  Past History:  Past Medical History: Last updated: 06/26/2008 macular degeneration Bell's palsy Allergic rhinitis Anxiety COPD Hyperlipidemia Hypertension neuropathy vit D deficiency ETD-- with myringotomy tubes   ENT -- Dr Catalina AntiguaAshley Royalty   Past Surgical History: Last updated: 02/28/2008 Total knee replacement x 2 LS discectomy Appendectomy Melanoma- back Corneal transplant Dexa (1999) Colonoscopy- neg w/ polyp (2000) Hysterectomy myringotomy tube R ear Bell's palsy (1998) 2D Echo EF 55%, normal (07/2004)  Family History: Last updated: 09/19/2007 Father: ? bone cancer Mother: ? heart problems, HTN Siblings:  daugter breast cancer  Social History: Last updated: 02/28/2008 Marital Status:widowed Children: 5- 1 daughter died of breast cancer Occupation: retired non smoker  Risk Factors: Smoking Status: never (07/13/2007)  Review of Systems General:  Denies chills, fever, loss of appetite, and malaise. Eyes:  Complains of vision loss-both eyes; denies discharge and eye irritation. ENT:  Complains of ear discharge and earache; denies postnasal drainage and  sore throat. CV:  Denies chest pain or discomfort and palpitations. Resp:  Denies chest pain with inspiration and shortness of breath. GI:  Denies nausea and vomiting. Derm:  Denies itching and rash. Neuro:  Denies headaches and sensation of room spinning. Heme:  Denies abnormal bruising and bleeding.  Physical Exam  General:  overwt elderly female  Head:  normocephalic, atraumatic, and no abnormalities observed.  no sinus or temporal tenderness Eyes:  pupils equal, pupils round, pupils reactive to light, and no injection.   Ears:  L ear canal is swollen and injected with pale / thin discharge  some tenderness over tragus no erythema or skin swelling  hearing is worse in that ear other TM clear -- and able to wear hearing aide Nose:  nares are injected and congested bilaterally  Mouth:  pharynx pink and moist, no erythema, and no exudates.  some clear post nasal drip Neck:  supple with full rom and no masses or thyromegally, no JVD or carotid bruit  Chest Wall:  No deformities, masses, or tenderness noted. Lungs:  Normal respiratory effort, chest expands symmetrically. Lungs are clear to auscultation, no crackles or wheezes. Heart:  RRR, M stable, no gallops Skin:  Intact without suspicious lesions or rashes Cervical Nodes:  No lymphadenopathy noted Psych:  normal affect, talkative and pleasant    Impression & Recommendations:  Problem # 1:  UNSPECIFIED INFECTIVE OTITIS EXTERNA (ICD-380.10) Assessment New  with pale ear d/c  and swollen canal (no skin changes) tx with cipro otic hc and update  no hearing aid for at least 10 days  if worse or fever knows to call Her updated medication list for this problem includes:    Cortisporin-tc 3.08-01-08-0.5 Mg/ml Susp (Neomycin-colist-hc-thonzonium) .Marland KitchenMarland KitchenMarland KitchenMarland Kitchen 4 drops in affected ear 3-4 times per day  Orders: Prescription Created Electronically [phone])  Problem # 2:  CHRONIC RHINITIS (ICD-472.0) Assessment: Deteriorated  worse nasal  congestion from afrin rebound  will stop that and get back on flonase daily continue zyrtec update if not imp in 2 wk  Orders: Prescription Created Electronically [id number])  Complete Medication List: 1)  Neurontin 300 Mg Caps (Gabapentin) .... One by mouth daily as needed 2)  Aspirin 325 Mg Tabs (Aspirin) .... Take one by mouth once a day as needed 3)  Refresh Dry Eye Therapy 1-1 % Soln (Glycerin-polysorbate 80) .... As needed 4)  Nitroquick 0.4 Mg Subl (Nitroglycerin) .... As needed 5)  Flonase 50 Mcg/act Susp (Fluticasone propionate) .... 2 sprays in each nostril once daily 6)  Ocuvite Tabs (Multiple vitamins-minerals) .... Two tabs in am and two tabs in pm 7)  B Complex Drops- Equiv To B12 1200 Mcg  .Marland Kitchen.. 1 dose daily 8)  Zyrtec Allergy 10 Mg Tabs (Cetirizine hcl) 9)  Multivitamins Tabs (Multiple vitamin) .... One daily 10)  Hydrochlorothiazide 25 Mg Tabs (Hydrochlorothiazide) .... One daily in morning 11)  Vitamin D3 2000 Unit Caps (Cholecalciferol) .... One  daily 12)  Vitamin B 12 ?mg  .... Take 1 tablet by mouth once a day 13)  Cortisporin-tc 3.08-01-08-0.5 Mg/ml Susp (Neomycin-colist-hc-thonzonium) .... 4 drops in affected ear 3-4 times per day  Patient Instructions: 1)  use the cipro hc ear drops as directed 2)  if worse ear pain or not improved in a week - update me  3)  large cotton ball is ok  4)  update me if fever  5)  do not wear hearing aid for at least 10 days or until better  6)  stop afrin 7)  continue zyrtec  8)  continue flonase (or start back on it)-- every day  Prescriptions: CORTISPORIN-TC 3.08-01-08-0.5 MG/ML SUSP (NEOMYCIN-COLIST-HC-THONZONIUM) 4 drops in affected ear 3-4 times per day  #1 bottle x 0   Entered and Authorized by:   Judith Part MD   Signed by:   Lewanda Rife LPN on 91/47/8295   Method used:   Telephoned to ...       [facility] [address] [phone]* (retail)       [address] [place], [place]  [id number]       Ph: [phone]        Fax: [phone]   RxID:   [id number] CIPRO HC 0.2-1 % SUSP (CIPROFLOXACIN-HYDROCORTISONE) 3 drops in affected ear two times a day until better  #1 bottle x 0   Entered and Authorized by:   Judith Part MD   Signed by:   Judith Part MD on 04/18/2010   Method used:   Electronically to        [facility] [address] [phone]* (retail)       [address] [place], [place]  [id number]       Ph: [phone]       Fax: [phone]   RxID:   [id number]  Medication Cortisporin phoned to [facility] pharmacy as instructed. Lewanda Rife LPN  April 18, 2010 5:41 PM   Orders Added: 1)  Prescription Created Electronically [[id number]] 2)  Est. Patient Level III [02542]    Current Allergies (reviewed today): ! * LISINOPRIL ! * HCTZ * NSAIDS GROUP CELEBREX (CELECOXIB)

## 2010-08-05 ENCOUNTER — Encounter: Payer: Self-pay | Admitting: Family Medicine

## 2010-09-10 ENCOUNTER — Other Ambulatory Visit: Payer: Self-pay | Admitting: Family Medicine

## 2010-09-10 DIAGNOSIS — D649 Anemia, unspecified: Secondary | ICD-10-CM

## 2010-09-10 DIAGNOSIS — I10 Essential (primary) hypertension: Secondary | ICD-10-CM

## 2010-09-10 DIAGNOSIS — E7800 Pure hypercholesterolemia, unspecified: Secondary | ICD-10-CM

## 2010-09-15 ENCOUNTER — Other Ambulatory Visit (INDEPENDENT_AMBULATORY_CARE_PROVIDER_SITE_OTHER): Payer: MEDICARE | Admitting: Family Medicine

## 2010-09-15 ENCOUNTER — Other Ambulatory Visit: Payer: Self-pay

## 2010-09-15 DIAGNOSIS — I10 Essential (primary) hypertension: Secondary | ICD-10-CM

## 2010-09-15 DIAGNOSIS — E785 Hyperlipidemia, unspecified: Secondary | ICD-10-CM

## 2010-09-15 DIAGNOSIS — D649 Anemia, unspecified: Secondary | ICD-10-CM

## 2010-09-16 LAB — CBC WITH DIFFERENTIAL/PLATELET
Basophils Absolute: 0 10*3/uL (ref 0.0–0.1)
Eosinophils Absolute: 0.2 10*3/uL (ref 0.0–0.7)
HCT: 35.1 % — ABNORMAL LOW (ref 36.0–46.0)
Hemoglobin: 12 g/dL (ref 12.0–15.0)
Lymphs Abs: 2.5 10*3/uL (ref 0.7–4.0)
MCHC: 34.2 g/dL (ref 30.0–36.0)
Monocytes Absolute: 0.8 10*3/uL (ref 0.1–1.0)
Neutro Abs: 4.2 10*3/uL (ref 1.4–7.7)
Platelets: 310 10*3/uL (ref 150.0–400.0)
RDW: 14.2 % (ref 11.5–14.6)

## 2010-09-16 LAB — RENAL FUNCTION PANEL
Albumin: 4.1 g/dL (ref 3.5–5.2)
CO2: 27 mEq/L (ref 19–32)
Chloride: 96 mEq/L (ref 96–112)
GFR: 55.11 mL/min — ABNORMAL LOW (ref >60.00)
Phosphorus: 3.4 mg/dL (ref 2.3–4.6)
Potassium: 4.9 mEq/L (ref 3.5–5.1)

## 2010-09-16 LAB — ALT: ALT: 16 U/L (ref 0–35)

## 2010-09-16 LAB — LIPID PANEL
HDL: 52.2 mg/dL (ref >39.00)
Triglycerides: 140 mg/dL (ref 0.0–149.0)
VLDL: 28 mg/dL (ref 0.0–40.0)

## 2010-09-18 ENCOUNTER — Ambulatory Visit: Payer: Self-pay | Admitting: Family Medicine

## 2010-09-19 ENCOUNTER — Ambulatory Visit: Payer: Self-pay | Admitting: Family Medicine

## 2010-09-22 ENCOUNTER — Ambulatory Visit (INDEPENDENT_AMBULATORY_CARE_PROVIDER_SITE_OTHER): Payer: MEDICARE | Admitting: Family Medicine

## 2010-09-22 ENCOUNTER — Encounter: Payer: Self-pay | Admitting: Family Medicine

## 2010-09-22 DIAGNOSIS — D649 Anemia, unspecified: Secondary | ICD-10-CM

## 2010-09-22 DIAGNOSIS — I10 Essential (primary) hypertension: Secondary | ICD-10-CM

## 2010-09-22 DIAGNOSIS — E785 Hyperlipidemia, unspecified: Secondary | ICD-10-CM

## 2010-09-22 DIAGNOSIS — G589 Mononeuropathy, unspecified: Secondary | ICD-10-CM

## 2010-09-22 MED ORDER — HYDROCHLOROTHIAZIDE 25 MG PO TABS: 25.0000 mg | ORAL_TABLET | ORAL | Status: AC

## 2010-09-22 NOTE — Assessment & Plan Note (Signed)
Lipids are stable with diet today Rev lab with pt  Rev low sat fat diet  Her main concern right now is quality of life in her 90s

## 2010-09-22 NOTE — Patient Instructions (Signed)
You are doing well  Labs are stable  Keep watching diet for sugar  I recommend you follow up in 6 months -- I will leave that up to you  I sent refill of your hctz to the pharmacy

## 2010-09-22 NOTE — Assessment & Plan Note (Signed)
bp is fairly controlled on hctz and refil done Labs reviewed Na stays slt low but stable  No changes at present  Asked to f/u 6 mo

## 2010-09-22 NOTE — Progress Notes (Signed)
Subjective:    Patient ID: Carolyn Farley, female    DOB: 12-18-1916, 77 y.o.   MRN: [id number]  HPI Here for f/u of chronic medical problems including hyperglycemia and hyperlipidemia and anemia and HTN  Wt is down 3 lb Less appetite - is more comfortable at a lighter weight   Is doing ok overall - lots of stuggles as she enters her 90s  vison and hearing are poor  Has help when she needs it from family  Has a little dog to keep her company- enjoys this   Hyperglycemia = gluc 94 fasting  This has improved over past several years  Last A1c was 6 She walks around the house with her dog  Does not use a cane- is acutually quite steady Watches diet for sugar- very careful about this  No junk food   Lipids are fairly stable Inc HDL LDL up a bit to 118 Lab Results  Component Value Date   CHOL 198 09/15/2010   CHOL 187 03/10/2010   CHOL 206* 06/18/2009   Lab Results  Component Value Date   HDL 52.20 09/15/2010   HDL 65.78 03/10/2010   HDL 56.70 06/18/2009   Lab Results  Component Value Date   LDLCALC 118* 09/15/2010   LDLCALC 109* 03/10/2010   LDLCALC 120* 03/18/2009   Lab Results  Component Value Date   TRIG 140.0 09/15/2010   TRIG 170.0* 03/10/2010   TRIG 111.0 06/18/2009   Lab Results  Component Value Date   CHOLHDL 4 09/15/2010   CHOLHDL 4 03/10/2010   CHOLHDL 4 06/18/2009     Anemia is better overall  Is eating more of a balanced diet as well  Does not eat out  Lab Results  Component Value Date   WBC 7.8 09/15/2010   HGB 12.0 09/15/2010   HCT 35.1* 09/15/2010   MCV 90.5 09/15/2010   PLT 310.0 09/15/2010     HTN is fairly controlled with 138/74 today  Still has neuropathy- hands and feet get numb -- no change overall / tolerates it well    Past Medical History  Diagnosis Date  . Macular degeneration   . Bell's palsy   . Allergic rhinitis   . Anxiety   . COPD (chronic obstructive pulmonary disease)   . Hyperlipidemia   . Hypertension   .  Neuropathy   . Vitamin D deficiency   . Myringotomy tube status     EDT  . Melanoma of back    Past Surgical History  Procedure Date  . Total knee arthroplasty     X 2  . Lumbar disc surgery     LS  . Appendectomy   . Corneal transplant   . Myringotomy     tube R ear    reports that she has never smoked. She does not have any smokeless tobacco history on file. Her alcohol and drug histories not on file. family history includes Cancer in her daughter and father; Heart disease in her mother; and Hypertension in her mother. Allergies  Allergen Reactions  . Celecoxib     REACTION: achy  . Lisinopril     REACTION: high K level  . Nsaids     REACTION: myalgia      Review of Systems Review of Systems  Constitutional: Negative for fever, and unexpected weight change. -- appetite is down  Eyes: Negative for pain and visual disturbance.  Respiratory: Negative for cough and shortness of breath.   Cardiovascular: Negative.  Gastrointestinal: Negative for nausea, diarrhea and constipation.  Genitourinary: Negative for urgency and frequency.  Skin: Negative for pallor.  Neurological: Negative for weakness, light-headedness,  and headaches.  Some joint pain Hematological: Negative for adenopathy. Does not bruise/bleed easily.  Psychiatric/Behavioral: Negative for dysphoric mood. The patient is not nervous/anxious.          Objective:   Physical Exam  Constitutional: She appears well-developed and well-nourished.       Elderly overwt female- no distress Very poor vision and hearing Mentally sharp  HENT:  Head: Normocephalic.  Mouth/Throat: Oropharynx is clear and moist.  Eyes: Conjunctivae are normal. Pupils are equal, round, and reactive to light.  Neck: Normal range of motion. Neck supple. No JVD present. Carotid bruit is not present. No thyromegaly present.  Cardiovascular:  Murmur heard. Pulmonary/Chest: Effort normal and breath sounds normal.  Abdominal: Soft.  Bowel sounds are normal. She exhibits no distension and no mass. There is no tenderness.  Musculoskeletal: She exhibits no edema and no tenderness.       Some OA periph joint changes   Lymphadenopathy:    She has no cervical adenopathy.  Neurological: She is alert. She has normal reflexes. No cranial nerve deficit. Coordination normal.  Skin: Skin is warm and dry. No rash noted. No erythema. No pallor.  Psychiatric: She has a normal mood and affect.          Assessment & Plan:

## 2010-09-22 NOTE — Assessment & Plan Note (Signed)
This is resolved with more balanced diet and mvi daily  No complaints Will continue to follow Rev cbc with pt

## 2010-10-14 ENCOUNTER — Other Ambulatory Visit: Payer: Self-pay | Admitting: *Deleted

## 2010-10-14 MED ORDER — FLUTICASONE PROPIONATE 50 MCG/ACT NA SUSP: 2.0000 | Freq: Every day | NASAL | Status: AC

## 2010-10-16 ENCOUNTER — Other Ambulatory Visit: Payer: Self-pay | Admitting: *Deleted

## 2010-10-16 MED ORDER — GABAPENTIN 300 MG PO CAPS: 300.0000 mg | ORAL_CAPSULE | Freq: Every day | ORAL | Status: DC | PRN

## 2010-10-16 NOTE — Telephone Encounter (Signed)
Medication phoned to [facility] [address] [address] pharmacy as instructed.

## 2010-10-16 NOTE — Telephone Encounter (Signed)
Px written for call in   

## 2010-12-16 ENCOUNTER — Encounter: Payer: Self-pay | Admitting: Family Medicine

## 2010-12-16 ENCOUNTER — Ambulatory Visit (INDEPENDENT_AMBULATORY_CARE_PROVIDER_SITE_OTHER): Payer: Medicare Other | Admitting: Family Medicine

## 2010-12-16 VITALS — BP 134/76 | HR 84 | Temp 99.0°F | Ht 60.0 in | Wt 160.5 lb

## 2010-12-16 DIAGNOSIS — G589 Mononeuropathy, unspecified: Secondary | ICD-10-CM

## 2010-12-16 NOTE — Progress Notes (Signed)
Subjective:    Patient ID: Carolyn Farley, female    DOB: 08-13-1916, 77 y.o.   MRN: [id number]  HPI Here for f/u of neuropathy and other health problems   Having tremor more in feet and legs -- worse in L and some pain in her foot  This keeps her up at night  The jumping of feet - just started  Daughter gave her a foot brace - no help   Pain in both feet at night for more than a year - worse for the last few months   Using cane more to walk for stability   Was on a baby asa per day- upset stomach and stopped it     Wt is down 4 lb  bp good 134/76 No cp or ha or sob  Not taking aspirin - bothers her stomach  neurontin 300 for neuropathy- afraid to take that every night  Does not sedate her that much  Hyperglycemia - a1c tends to run 5-6  With age short term memory is worsening   Patient Active Problem List  Diagnoses  . MELANOMA  . LIPOMA OF OTHER SKIN AND SUBCUTANEOUS TISSUE  . UNSPECIFIED VITAMIN D DEFICIENCY  . HYPERLIPIDEMIA  . ANEMIA  . ANXIETY  . CARPAL TUNNEL SYNDROME  . NEUROPATHY  . UNSPECIFIED INFECTIVE OTITIS EXTERNA  . EUSTACHIAN TUBE DYSFUNCTION, CHRONIC  . HEARING LOSS  . HYPERTENSION  . CHRONIC RHINITIS  . COPD  . OVERACTIVE BLADDER  . OSTEOARTHRITIS, HIPS, BILATERAL  . MURMUR  . URINARY INCONTINENCE, MIXED   Past Medical History  Diagnosis Date  . Macular degeneration   . Bell's palsy   . Allergic rhinitis   . Anxiety   . COPD (chronic obstructive pulmonary disease)   . Hyperlipidemia   . Hypertension   . Neuropathy   . Vitamin D deficiency   . Myringotomy tube status     EDT  . Melanoma of back    Past Surgical History  Procedure Date  . Total knee arthroplasty     X 2  . Lumbar disc surgery     LS  . Appendectomy   . Corneal transplant   . Myringotomy     tube R ear   History  Substance Use Topics  . Smoking status: Never Smoker   . Smokeless tobacco: Not on file  . Alcohol Use: Not on file   Family History    Problem Relation Age of Onset  . Heart disease Mother     ? heart problems  . Hypertension Mother   . Cancer Father     ? bone  . Cancer Daughter     Breast   Allergies  Allergen Reactions  . Celecoxib     REACTION: achy  . Lisinopril     REACTION: high K level  . Nsaids     REACTION: myalgia   Current Outpatient Prescriptions on File Prior to Visit  Medication Sig Dispense Refill  . Cyanocobalamin (VITAMIN B 12 PO) Take by mouth daily.        . fluticasone (FLONASE) 50 MCG/ACT nasal spray 2 sprays by Nasal route daily.  16 g  11  . gabapentin (NEURONTIN) 300 MG capsule Take 1 capsule (300 mg total) by mouth daily as needed.  30 capsule  11  . hydrochlorothiazide 25 MG tablet Take 1 tablet (25 mg total) by mouth every morning.  30 tablet  11  . Multiple Vitamin (MULTIVITAMIN) tablet Take 1 tablet by mouth  daily.        . Multiple Vitamins-Minerals (OCUVITE PO) Take two tablets in am and two tabs in pm       . aspirin 325 MG tablet Take 325 mg by mouth daily as needed.        . B Complex Vitamins SOLN Take by mouth daily. Equiv to B 12 1200 mg.       . cetirizine (ZYRTEC) 10 MG tablet Take 10 mg by mouth daily.        . Cholecalciferol (VITAMIN D3) 2000 UNITS capsule Take 2,000 Units by mouth daily.        . Glycerin-Polysorbate 80 (REFRESH DRY EYE THERAPY) 1-1 % SOLN Apply to eye as needed.        . neomycin-colistin-hydrocortisone-thonzonium (CORTISPORIN TC) 3.08-01-08-0.5 MG/ML otic suspension Use 4 drops in affected ear 3-4 times per day       . nitroGLYCERIN (NITROSTAT) 0.4 MG SL tablet Place 0.4 mg under the tongue as needed.                Review of Systems Review of Systems  Constitutional: Negative for fever, appetite change, fatigue and unexpected weight change.  Eyes: Negative for pain and visual disturbance.  Respiratory: Negative for cough and shortness of breath.   Cardiovascular: Negative.  for cp or sob or palp Gastrointestinal: Negative for nausea,  diarrhea and constipation.  Genitourinary: Negative for urgency and frequency.  Skin: Negative for pallor. or rash or flushing  Neurological: Negative for weakness, light-headedness, numbness and headaches.  Hematological: Negative for adenopathy. Does not bruise/bleed easily.  Psychiatric/Behavioral: Negative for dysphoric mood. The patient is not nervous/anxious.          Objective:   Physical Exam  Constitutional: She appears well-developed and well-nourished. No distress.       Frail appearing elderly female with poor vision in no distress  HENT:  Head: Normocephalic and atraumatic.  Eyes: Pupils are equal, round, and reactive to light.  Neck: Normal range of motion. Neck supple. No JVD present. Carotid bruit is not present. No thyromegaly present.  Cardiovascular: Normal rate, regular rhythm, normal heart sounds and intact distal pulses.   Pulmonary/Chest: Effort normal and breath sounds normal. No respiratory distress. She has no wheezes.  Abdominal: Soft. Bowel sounds are normal. She exhibits no distension. There is no tenderness.  Musculoskeletal: She exhibits tenderness. She exhibits no edema.       In general hypersensitive feet with tenderness on the soles to light touch Also tapping and moving feet a lot   Lymphadenopathy:    She has no cervical adenopathy.  Neurological: She is alert. She has normal reflexes. No cranial nerve deficit or sensory deficit. Coordination normal.  Skin: Skin is warm and dry. No rash noted. No erythema. No pallor.  Psychiatric: She has a normal mood and affect.       Talkative and mentally sharp today          Assessment & Plan:

## 2010-12-16 NOTE — Assessment & Plan Note (Signed)
Discomfort from this is worsening  Disc imp of taking the gabapentin every night and stopping tylenol pm if it does not help  Will adv dose to 600 if necessary Disc imp of safety with daughter- in light of dizziness that can occur as side eff Rev lab from last visit with pt and family  Enc to continue other med incl B12

## 2010-12-16 NOTE — Patient Instructions (Signed)
For your foot pain and discomfort take your gabapentin EVERY night (watch for dizziness- use caution) Stop the tylenol pm since it is not helping  If not improved in 1 week - call and we will increase dose to 2 pills each night  Please keep me updated You are doing well Continue your other medicines including B12

## 2011-03-18 ENCOUNTER — Telehealth: Payer: Self-pay | Admitting: *Deleted

## 2011-03-18 MED ORDER — GABAPENTIN 300 MG PO CAPS: ORAL_CAPSULE | ORAL | Status: DC

## 2011-03-18 MED ORDER — GABAPENTIN 300 MG PO CAPS: 300.0000 mg | ORAL_CAPSULE | Freq: Two times a day (BID) | ORAL | Status: DC | PRN

## 2011-03-18 NOTE — Telephone Encounter (Signed)
Spoke with pt to let her know med had been called in to [facility]. Spoke with Weston Brass at [facility] and he discarded rx for instructions 1 pill bid and will fill the 2 pills at hs.

## 2011-03-18 NOTE — Telephone Encounter (Signed)
Pt was told to take 2 gabapentin a day.  She's doing this and it helps.  She needs a new script sent to [facility] [address]

## 2011-03-18 NOTE — Telephone Encounter (Signed)
Will refill electronically  Please let her know  I accidentally wrote for 1 pill bid and meant 2 pills qhs  Already sent it -- will send again - let pharmacy know I meant the latter-thanks

## 2011-06-17 ENCOUNTER — Encounter: Payer: Self-pay | Admitting: Family Medicine

## 2011-06-17 ENCOUNTER — Ambulatory Visit (INDEPENDENT_AMBULATORY_CARE_PROVIDER_SITE_OTHER): Payer: Medicare Other | Admitting: Family Medicine

## 2011-06-17 VITALS — BP 134/76 | HR 84 | Temp 99.6°F | Ht 60.0 in | Wt 152.8 lb

## 2011-06-17 DIAGNOSIS — Z9181 History of falling: Secondary | ICD-10-CM

## 2011-06-17 DIAGNOSIS — R630 Anorexia: Secondary | ICD-10-CM

## 2011-06-17 DIAGNOSIS — R5383 Other fatigue: Secondary | ICD-10-CM | POA: Insufficient documentation

## 2011-06-17 DIAGNOSIS — H3530 Unspecified macular degeneration: Secondary | ICD-10-CM | POA: Insufficient documentation

## 2011-06-17 DIAGNOSIS — R269 Unspecified abnormalities of gait and mobility: Secondary | ICD-10-CM

## 2011-06-17 DIAGNOSIS — R296 Repeated falls: Secondary | ICD-10-CM | POA: Insufficient documentation

## 2011-06-17 DIAGNOSIS — R5381 Other malaise: Secondary | ICD-10-CM

## 2011-06-17 DIAGNOSIS — H9190 Unspecified hearing loss, unspecified ear: Secondary | ICD-10-CM

## 2011-06-17 DIAGNOSIS — I10 Essential (primary) hypertension: Secondary | ICD-10-CM

## 2011-06-17 NOTE — Assessment & Plan Note (Signed)
Elderly pt/ homebound and blind/ failure to thrive getting weaker - also with neuropathy Frequent falls - will ref to home PT/ care (poss help with ADLs too)  Adv to use walker instead of cane - needs more assistance Disc need for better balance/ safety and strength

## 2011-06-17 NOTE — Progress Notes (Signed)
Subjective:    Patient ID: Carolyn Farley, female    DOB: 05-31-1917, 78 y.o.   MRN: [id number]  HPI Here for unsteadiness/ general weakness / and fatigue and poor appetite Is falling frequently  Larey Seat today- after taking a shower -- is sore now  Tired for a while - months / "old age is too much "  Has quad cane and regular walker and walker with a seat (fell today today because she took nothing in with her )  Is homebound -- macular degeneration- cannot see  Lives with her daughter (who is at work most of the day)  Wants to stay at home as long as she can  [employer] - and they said that they would do home care -(even wonders about hospice in light of age) Needs someone to come in and out and check on her and help with meals   Has good days and bad days -- appetite wise  Wt is down 8 lb   Joints hurt all the time Neuropathy continues to bother her  Has hx of low vit D  Advanced age     Is sore in buttocks after her fall - but no indication of fracture Has quad cane and several walkers- one with a seat  Patient Active Problem List  Diagnoses  . MELANOMA  . LIPOMA OF OTHER SKIN AND SUBCUTANEOUS TISSUE  . UNSPECIFIED VITAMIN D DEFICIENCY  . HYPERLIPIDEMIA  . ANEMIA  . ANXIETY  . CARPAL TUNNEL SYNDROME  . NEUROPATHY  . UNSPECIFIED INFECTIVE OTITIS EXTERNA  . EUSTACHIAN TUBE DYSFUNCTION, CHRONIC  . HEARING LOSS  . HYPERTENSION  . CHRONIC RHINITIS  . COPD  . OVERACTIVE BLADDER  . OSTEOARTHRITIS, HIPS, BILATERAL  . MURMUR  . URINARY INCONTINENCE, MIXED  . Macular degeneration  . Fatigue  . Loss of appetite  . Gait disorder  . Frequent falls   Past Medical History  Diagnosis Date  . Macular degeneration   . Bell's palsy   . Allergic rhinitis   . Anxiety   . COPD (chronic obstructive pulmonary disease)   . Hyperlipidemia   . Hypertension   . Neuropathy   . Vitamin D deficiency   . Myringotomy tube status     EDT  . Melanoma of back    Past Surgical  History  Procedure Date  . Total knee arthroplasty     X 2  . Lumbar disc surgery     LS  . Appendectomy   . Corneal transplant   . Myringotomy     tube R ear   History  Substance Use Topics  . Smoking status: Never Smoker   . Smokeless tobacco: Not on file  . Alcohol Use: Not on file   Family History  Problem Relation Age of Onset  . Heart disease Mother     ? heart problems  . Hypertension Mother   . Cancer Father     ? bone  . Cancer Daughter     Breast   Allergies  Allergen Reactions  . Celecoxib     REACTION: achy  . Lisinopril     REACTION: high K level  . Nsaids     REACTION: myalgia   Current Outpatient Prescriptions on File Prior to Visit  Medication Sig Dispense Refill  . B Complex Vitamins SOLN Take by mouth daily. Equiv to B 12 1200 mg.       . cetirizine (ZYRTEC) 10 MG tablet Take 10 mg by mouth daily.        Marland Kitchen  Cholecalciferol (VITAMIN D3) 1000 UNITS CAPS Take 1 capsule by mouth daily.        . Cholecalciferol (VITAMIN D3) 2000 UNITS capsule Take 2,000 Units by mouth daily.        . Cyanocobalamin (VITAMIN B 12 PO) Take by mouth daily.        . fluticasone (FLONASE) 50 MCG/ACT nasal spray 2 sprays by Nasal route daily.  16 g  11  . gabapentin (NEURONTIN) 300 MG capsule Take 1 capsule (300 mg total) by mouth 2 (two) times daily as needed.  60 capsule  11  . gabapentin (NEURONTIN) 300 MG capsule 2 pills by mouth at bedtime  60 capsule  11  . hydrochlorothiazide 25 MG tablet Take 1 tablet (25 mg total) by mouth every morning.  30 tablet  11  . Multiple Vitamin (MULTIVITAMIN) tablet Take 1 tablet by mouth daily.        . Multiple Vitamins-Minerals (OCUVITE PO) Take two tablets in am and two tabs in pm       . Omega-3 Fatty Acids (FISH OIL) 1000 MG CAPS Take 1 capsule by mouth daily with breakfast.        . aspirin 325 MG tablet Take 325 mg by mouth daily as needed.        . Glycerin-Polysorbate 80 (REFRESH DRY EYE THERAPY) 1-1 % SOLN Apply to eye as needed.         . neomycin-colistin-hydrocortisone-thonzonium (CORTISPORIN TC) 3.08-01-08-0.5 MG/ML otic suspension Use 4 drops in affected ear 3-4 times per day       . nitroGLYCERIN (NITROSTAT) 0.4 MG SL tablet Place 0.4 mg under the tongue as needed.             Review of Systems Review of Systems  Constitutional: Negative for fever, pos for fatigue and appetite change and generalized weakness Eyes: Negative for pain and pos for blindness from macular degeneration ENT pos for hearing loss  Respiratory: Negative for cough and shortness of breath.   Cardiovascular: Negative for cp or palpitations    Gastrointestinal: Negative for nausea, diarrhea and constipation.  Genitourinary: Negative for urgency and frequency. pos for incontinence Skin: Negative for pallor or rash   Neurological: Negative for dizziness/ headaches/ pos for generalized weakness and very poor balance  Hematological: Negative for adenopathy. Does not bruise/bleed easily.  Psychiatric/Behavioral: Negative for dysphoric mood. The patient is sometimes anxious , although memory seems intact        Objective:   Physical Exam  Constitutional: She appears well-developed and well-nourished. No distress.       Frail appearing elderly female who walks poorly with quad cane  HENT:  Head: Normocephalic and atraumatic.  Right Ear: External ear normal.  Left Ear: External ear normal.  Mouth/Throat: Oropharynx is clear and moist.       Scant cerumen  Eyes: Conjunctivae and EOM are normal. Pupils are equal, round, and reactive to light. Right eye exhibits discharge. Left eye exhibits discharge. No scleral icterus.       Pt is legally blind due to macular degeneration Eyes tear frequently  Neck: Neck supple. No JVD present. Carotid bruit is not present. Erythema present. No thyromegaly present.  Cardiovascular: Normal rate, regular rhythm and normal heart sounds.   Pulmonary/Chest: Effort normal and breath sounds normal. No respiratory  distress. She has no wheezes.  Abdominal: Soft. Bowel sounds are normal. She exhibits no distension and no mass. There is no tenderness.  Musculoskeletal: She exhibits tenderness. She exhibits no  edema.       oa diffusely No spinal or hip tenderness today Some soreness of buttocks/ no bruising Can walk with quad cane and assistance  Poor rom of LS and TS and all ext  Lymphadenopathy:    She has no cervical adenopathy.  Neurological: She is alert. She has normal reflexes. She displays atrophy and tremor. No cranial nerve deficit or sensory deficit. She exhibits normal muscle tone. Gait abnormal. Coordination normal.       rhomberg yields poor balance in general  Gait is slow - requiring assistance with cane and a person on her other side Cannot rise from sitting without assistance -- even from chair with arms   Skin: Skin is warm and dry. No rash noted. No erythema. No pallor.  Psychiatric: She has a normal mood and affect. Her behavior is normal.       Mildly anxious but pleasant Answers questions appropriately          Assessment & Plan:

## 2011-06-17 NOTE — Patient Instructions (Signed)
We will do referral for home PT/ help at check out  Please use walker instead of cane - this is safer  Use cool compress on buttock  Injury as needed

## 2011-06-21 NOTE — Assessment & Plan Note (Signed)
Stressed imp of using walker instead of cane for better overall support  Will do this  Ordered home care eval  With PT and also social work - for help with mobility/ ambulation/ balance and ADLs

## 2011-06-21 NOTE — Assessment & Plan Note (Signed)
Enc family to work on regular meals with her  Also ref to home care  May be rel to adv age and other health problems 8 lb wt loss - will follow

## 2011-06-21 NOTE — Assessment & Plan Note (Signed)
This renders her homebound/ legally blind Needs help with ADLs and home care was ordered

## 2011-06-21 NOTE — Assessment & Plan Note (Signed)
Multifactorial with advanced age/ failure to thrive and deconditioning Rev last labs Ordered PT and home care and will continue to monitor (pt is homebound)

## 2011-06-21 NOTE — Assessment & Plan Note (Signed)
bp in fair control at this time  No changes needed  Disc lifstyle change with low sodium diet and exercise   

## 2011-09-01 ENCOUNTER — Ambulatory Visit (INDEPENDENT_AMBULATORY_CARE_PROVIDER_SITE_OTHER): Payer: Medicare Other | Admitting: Family Medicine

## 2011-09-01 ENCOUNTER — Encounter: Payer: Self-pay | Admitting: Family Medicine

## 2011-09-01 VITALS — BP 142/84 | HR 79 | Temp 98.8°F | Ht 60.0 in | Wt 152.2 lb

## 2011-09-01 DIAGNOSIS — R627 Adult failure to thrive: Secondary | ICD-10-CM | POA: Insufficient documentation

## 2011-09-01 DIAGNOSIS — I10 Essential (primary) hypertension: Secondary | ICD-10-CM

## 2011-09-01 DIAGNOSIS — E559 Vitamin D deficiency, unspecified: Secondary | ICD-10-CM

## 2011-09-01 DIAGNOSIS — R630 Anorexia: Secondary | ICD-10-CM

## 2011-09-01 DIAGNOSIS — R269 Unspecified abnormalities of gait and mobility: Secondary | ICD-10-CM

## 2011-09-01 DIAGNOSIS — Z7189 Other specified counseling: Secondary | ICD-10-CM

## 2011-09-01 NOTE — Assessment & Plan Note (Signed)
Actually doing better when she can eat what she wants - quite a bit of sweets but wt is stable Will continue to follow  tsh today

## 2011-09-01 NOTE — Patient Instructions (Signed)
Labs today I think your conditions are stable  Keep using walker all the time No change in medicines  Weight has not changed  We filled out DNR papers today

## 2011-09-01 NOTE — Assessment & Plan Note (Signed)
bp in fair control at this time  No changes needed  Disc lifstyle change with low sodium diet and exercise   Labs today 

## 2011-09-01 NOTE — Progress Notes (Signed)
Subjective:    Patient ID: Carolyn Farley, female    DOB: 1916-12-13, 78 y.o.   MRN: [id number]  HPI Here for f/u of failure to thrive (general geriatric decline) with poor balance/gait disorder/ macular deg (legally blind) / poor appetite / excessive sleepiness / hearing loss   Hearing aid not working as well   Last visit disc ref for home care (she is home bound) Is currently living with her daugher who has POA  (struggle getting consent for this )  She gets outside help - for 2 hours a day - but pt generally fights this   Sleeps a lot during the day- says she does not sleep well at night   Still enjoys her little dog  Wants to stay at home as long as she can   Disc balance at last visit  Dan Humphreys - is now using it all the time for any ambulation- finally likes it   bp is 142/84    Today No cp or palpitations or headaches or edema  No side effects to medicines    Takes gabapentin for chronic neuropathy nad pain   Despite poor appetite and eating habits- wt is stable today Is eating some goodies and her daughter fixes her meals  Really likes home made apple pies Also gets out a bit to eat     Chemistry      Component Value Date/Time   NA 133* 09/15/2010 1643   K 4.9 09/15/2010 1643   CL 96 09/15/2010 1643   CO2 27 09/15/2010 1643   BUN 23 09/15/2010 1643   CREATININE 1.0 09/15/2010 1643      Component Value Date/Time   CALCIUM 9.7 09/15/2010 1643   ALKPHOS 58 06/16/2007 0904   AST 22 09/15/2010 1643   ALT 16 09/15/2010 1643   BILITOT 0.8 06/16/2007 0904      Lab Results  Component Value Date   CHOL 198 09/15/2010   HDL 52.20 09/15/2010   LDLCALC 118* 09/15/2010   LDLDIRECT 146.5 06/18/2009   TRIG 140.0 09/15/2010   CHOLHDL 4 09/15/2010     Patient Active Problem List  Diagnoses  . MELANOMA  . LIPOMA OF OTHER SKIN AND SUBCUTANEOUS TISSUE  . UNSPECIFIED VITAMIN D DEFICIENCY  . HYPERLIPIDEMIA  . ANEMIA  . ANXIETY  . CARPAL TUNNEL SYNDROME  . NEUROPATHY  .  EUSTACHIAN TUBE DYSFUNCTION, CHRONIC  . HEARING LOSS  . HYPERTENSION  . CHRONIC RHINITIS  . COPD  . OVERACTIVE BLADDER  . OSTEOARTHRITIS, HIPS, BILATERAL  . MURMUR  . URINARY INCONTINENCE, MIXED  . Macular degeneration  . Fatigue  . Loss of appetite  . Gait disorder  . Frequent falls  . Adult failure to thrive  . Advanced directives, counseling/discussion   Past Medical History  Diagnosis Date  . Macular degeneration   . Bell's palsy   . Allergic rhinitis   . Anxiety   . COPD (chronic obstructive pulmonary disease)   . Hyperlipidemia   . Hypertension   . Neuropathy   . Vitamin d deficiency   . Myringotomy tube status     EDT  . Melanoma of back    Past Surgical History  Procedure Date  . Total knee arthroplasty     X 2  . Lumbar disc surgery     LS  . Appendectomy   . Corneal transplant   . Myringotomy     tube R ear   History  Substance Use Topics  . Smoking status:  Never Smoker   . Smokeless tobacco: Not on file  . Alcohol Use: Not on file   Family History  Problem Relation Age of Onset  . Heart disease Mother     ? heart problems  . Hypertension Mother   . Cancer Father     ? bone  . Cancer Daughter     Breast   Allergies  Allergen Reactions  . Celecoxib     REACTION: achy  . Lisinopril     REACTION: high K level  . Nsaids     REACTION: myalgia   Current Outpatient Prescriptions on File Prior to Visit  Medication Sig Dispense Refill  . aspirin 325 MG tablet Take 325 mg by mouth daily as needed.        . B Complex Vitamins SOLN Take by mouth daily. Equiv to B 12 1200 mg.       . cetirizine (ZYRTEC) 10 MG tablet Take 10 mg by mouth daily.        . Cholecalciferol (VITAMIN D3) 1000 UNITS CAPS Take 1 capsule by mouth daily.        . Cholecalciferol (VITAMIN D3) 2000 UNITS capsule Take 2,000 Units by mouth daily.        . Cyanocobalamin (VITAMIN B 12 PO) Take by mouth daily.        . fluticasone (FLONASE) 50 MCG/ACT nasal spray 2 sprays by  Nasal route daily.  16 g  11  . gabapentin (NEURONTIN) 300 MG capsule Take 1 capsule (300 mg total) by mouth 2 (two) times daily as needed.  60 capsule  11  . gabapentin (NEURONTIN) 300 MG capsule 2 pills by mouth at bedtime  60 capsule  11  . Glycerin-Polysorbate 80 (REFRESH DRY EYE THERAPY) 1-1 % SOLN Apply to eye as needed.        . hydrochlorothiazide 25 MG tablet Take 1 tablet (25 mg total) by mouth every morning.  30 tablet  11  . Multiple Vitamin (MULTIVITAMIN) tablet Take 1 tablet by mouth daily.        . Multiple Vitamins-Minerals (OCUVITE PO) Take two tablets in am and two tabs in pm       . neomycin-colistin-hydrocortisone-thonzonium (CORTISPORIN TC) 3.08-01-08-0.5 MG/ML otic suspension Use 4 drops in affected ear 3-4 times per day       . nitroGLYCERIN (NITROSTAT) 0.4 MG SL tablet Place 0.4 mg under the tongue as needed.        . Omega-3 Fatty Acids (FISH OIL) 1000 MG CAPS Take 1 capsule by mouth daily with breakfast.           Review of Systems Review of Systems  Constitutional: Negative for fever, appetite change,  and unexpected weight change. pos for fatigue  Eyes: Negative for pain and visual disturbance.  Respiratory: Negative for cough and shortness of breath.   Cardiovascular: Negative for cp or palpitations    Gastrointestinal: Negative for nausea, diarrhea and constipation.  Genitourinary: Negative for urgency and frequency.  Skin: Negative for pallor or rash   Neurological: Negative for weakness, light-headedness, numbness and headaches.  MSK pos for joint and back pain from arthritis Hematological: Negative for adenopathy. Does not bruise/bleed easily.  Psychiatric/Behavioral: Negative for dysphoric mood. The patient is somewhat anxious - also states that it is her time "to go"         Objective:   Physical Exam  Constitutional: She appears well-developed and well-nourished. No distress.       Frail app elderly  female with poor hearing and vision   HENT:    Head: Normocephalic and atraumatic.  Mouth/Throat: Oropharynx is clear and moist.  Eyes: Conjunctivae and EOM are normal. Pupils are equal, round, and reactive to light. No scleral icterus.  Neck: Normal range of motion. Neck supple. No JVD present. Carotid bruit is not present. No thyromegaly present.  Cardiovascular: Normal rate, regular rhythm, normal heart sounds and intact distal pulses.  Exam reveals no gallop.   Pulmonary/Chest: Effort normal and breath sounds normal. No respiratory distress. She has no wheezes. She exhibits no tenderness.  Abdominal: Soft. Bowel sounds are normal. She exhibits no distension, no abdominal bruit and no mass. There is no tenderness.  Musculoskeletal: She exhibits tenderness. She exhibits no edema.       Diffuse joint tenderness/ stiffness Moves slowly but is steady and stable with her walker  Lymphadenopathy:    She has no cervical adenopathy.  Neurological: She is alert. She has normal reflexes. A sensory deficit is present. No cranial nerve deficit. She exhibits normal muscle tone. Coordination normal.       Poor sens in fingers and toes from neuropathy  Skin: Skin is warm and dry. No rash noted. No erythema. No pallor.  Psychiatric: She has a normal mood and affect.       Pt does not seem depressed but candidly talks about desire to pass given her age, and also her DNR wishes and specifications  Worked on her paperwork together  Mentally sharp and pleasant today  No SI          Assessment & Plan:

## 2011-09-01 NOTE — Assessment & Plan Note (Signed)
Can keep weight stable if she has snacks avail that she likes - like sweets  Can supplement with protein shakes too  Overall doing ok

## 2011-09-01 NOTE — Assessment & Plan Note (Signed)
Level today on otc supplement Disc imp to overall and bone health

## 2011-09-01 NOTE — Assessment & Plan Note (Signed)
Long disc re: wishes as to end of life issues Wishes to be DNR Forms filled out for home and MOST form done as well  Pt was very clear about her wishes/ and mentally sharp today despite blindness and hearing loss

## 2011-09-01 NOTE — Assessment & Plan Note (Signed)
Doing better after some PT and now uses walker full time Disc safety issues in the home and getting some help

## 2011-09-02 LAB — COMPREHENSIVE METABOLIC PANEL
AST: 22 U/L (ref 0–37)
Albumin: 4.1 g/dL (ref 3.5–5.2)
Alkaline Phosphatase: 47 U/L (ref 39–117)
BUN: 21 mg/dL (ref 6–23)
Potassium: 4.4 mEq/L (ref 3.5–5.1)
Sodium: 136 mEq/L (ref 135–145)
Total Bilirubin: 0.6 mg/dL (ref 0.3–1.2)

## 2011-09-03 ENCOUNTER — Telehealth: Payer: Self-pay | Admitting: Family Medicine

## 2011-09-03 LAB — VITAMIN D 25 HYDROXY (VIT D DEFICIENCY, FRACTURES): Vit D, 25-Hydroxy: 58 ng/mL (ref 30–89)

## 2011-09-03 NOTE — Telephone Encounter (Signed)
Joyce advised as instructed via telephone. 

## 2011-09-03 NOTE — Telephone Encounter (Signed)
Please let pt 's family know that labs overall look ok incl vit D level

## 2011-09-07 ENCOUNTER — Emergency Department: Payer: Self-pay

## 2011-09-07 ENCOUNTER — Telehealth: Payer: Self-pay | Admitting: Family Medicine

## 2011-09-07 LAB — COMPREHENSIVE METABOLIC PANEL
Albumin: 4.2 g/dL (ref 3.4–5.0)
Alkaline Phosphatase: 56 U/L (ref 50–136)
Anion Gap: 11 (ref 7–16)
Calcium, Total: 9.8 mg/dL (ref 8.5–10.1)
Co2: 26 mmol/L (ref 21–32)
Glucose: 104 mg/dL — ABNORMAL HIGH (ref 65–99)
Osmolality: 268 (ref 275–301)
SGPT (ALT): 17 U/L
Sodium: 131 mmol/L — ABNORMAL LOW (ref 136–145)
Total Protein: 8.5 g/dL — ABNORMAL HIGH (ref 6.4–8.2)

## 2011-09-07 LAB — URINALYSIS, COMPLETE
Glucose,UR: NEGATIVE mg/dL (ref 0–75)
Nitrite: NEGATIVE
Ph: 5 (ref 4.5–8.0)
Protein: NEGATIVE
Specific Gravity: 1.023 (ref 1.003–1.030)
Squamous Epithelial: 13

## 2011-09-07 LAB — CBC
HCT: 36.3 % (ref 35.0–47.0)
HGB: 12.3 g/dL (ref 12.0–16.0)
Platelet: 385 10*3/uL (ref 150–440)
RBC: 4.01 10*6/uL (ref 3.80–5.20)

## 2011-09-07 LAB — CK TOTAL AND CKMB (NOT AT ARMC): CK, Total: 31 U/L (ref 21–215)

## 2011-09-07 NOTE — Telephone Encounter (Signed)
Thanks- call for records from [facility] when they are done please

## 2011-09-07 NOTE — Telephone Encounter (Signed)
Caller: Joyce/Dtr.; PCP: Roxy Manns A.; CB#: [phone]; Call regarding Fatigue; Onset 09/06/11.  Sudden onset of fatigue 09/06/11. Other emergent sx ruled out.   Advised ED per Weakness or Paralysis protocol and call ended after 1700.   Caller states plan to go to [facility] ED.

## 2011-09-08 NOTE — Telephone Encounter (Signed)
ER notes requested from [facility] today.

## 2011-09-09 LAB — URINE CULTURE

## 2011-09-21 ENCOUNTER — Ambulatory Visit (INDEPENDENT_AMBULATORY_CARE_PROVIDER_SITE_OTHER): Payer: Medicare Other | Admitting: Family Medicine

## 2011-09-21 ENCOUNTER — Encounter: Payer: Self-pay | Admitting: Family Medicine

## 2011-09-21 ENCOUNTER — Other Ambulatory Visit: Payer: Self-pay | Admitting: Family Medicine

## 2011-09-21 VITALS — BP 140/72 | HR 74 | Temp 97.6°F | Ht 60.0 in | Wt 142.8 lb

## 2011-09-21 DIAGNOSIS — N390 Urinary tract infection, site not specified: Secondary | ICD-10-CM | POA: Insufficient documentation

## 2011-09-21 DIAGNOSIS — R627 Adult failure to thrive: Secondary | ICD-10-CM

## 2011-09-21 DIAGNOSIS — R630 Anorexia: Secondary | ICD-10-CM

## 2011-09-21 DIAGNOSIS — R300 Dysuria: Secondary | ICD-10-CM

## 2011-09-21 LAB — POCT URINALYSIS DIPSTICK
Bilirubin, UA: NEGATIVE
Glucose, UA: NEGATIVE
Ketones, UA: NEGATIVE
Nitrite, UA: NEGATIVE

## 2011-09-21 LAB — POCT UA - MICROSCOPIC ONLY

## 2011-09-21 MED ORDER — CEPHALEXIN 500 MG PO CAPS: 500.0000 mg | ORAL_CAPSULE | Freq: Two times a day (BID) | ORAL | Status: AC

## 2011-09-21 NOTE — Progress Notes (Signed)
Subjective:    Patient ID: Carolyn Farley, female    DOB: 1917/05/07, 78 y.o.   MRN: [id number]  HPI Here for possible uti  Had uti in the hospital -- and has not re gained her appetite or energy level   Did improve some - but not all the way better  tues - better MS and appetite  Wed - not quite as good Thursday- completely down -- confuses easily   C/o hurting all over  Is really uncomfortable  ? If could consider stronger pain control in future if she were carefully monitored?  No dysuria  ? Discomfort in bladder area  ? If more frequency- feels like she has to go and then does not  Is really tired in general  Patient Active Problem List  Diagnoses  . MELANOMA  . LIPOMA OF OTHER SKIN AND SUBCUTANEOUS TISSUE  . UNSPECIFIED VITAMIN D DEFICIENCY  . HYPERLIPIDEMIA  . ANEMIA  . ANXIETY  . CARPAL TUNNEL SYNDROME  . NEUROPATHY  . EUSTACHIAN TUBE DYSFUNCTION, CHRONIC  . HEARING LOSS  . HYPERTENSION  . CHRONIC RHINITIS  . COPD  . OVERACTIVE BLADDER  . OSTEOARTHRITIS, HIPS, BILATERAL  . MURMUR  . URINARY INCONTINENCE, MIXED  . Macular degeneration  . Fatigue  . Loss of appetite  . Gait disorder  . Frequent falls  . Adult failure to thrive  . Advanced directives, counseling/discussion  . UTI (lower urinary tract infection)   Past Medical History  Diagnosis Date  . Macular degeneration   . Bell's palsy   . Allergic rhinitis   . Anxiety   . COPD (chronic obstructive pulmonary disease)   . Hyperlipidemia   . Hypertension   . Neuropathy   . Vitamin d deficiency   . Myringotomy tube status     EDT  . Melanoma of back    Past Surgical History  Procedure Date  . Total knee arthroplasty     X 2  . Lumbar disc surgery     LS  . Appendectomy   . Corneal transplant   . Myringotomy     tube R ear   History  Substance Use Topics  . Smoking status: Never Smoker   . Smokeless tobacco: Not on file  . Alcohol Use: Not on file   Family History  Problem  Relation Age of Onset  . Heart disease Mother     ? heart problems  . Hypertension Mother   . Cancer Father     ? bone  . Cancer Daughter     Breast   Allergies  Allergen Reactions  . Celecoxib     REACTION: achy  . Lisinopril     REACTION: high K level  . Nsaids     REACTION: myalgia   Current Outpatient Prescriptions on File Prior to Visit  Medication Sig Dispense Refill  . aspirin 325 MG tablet Take 325 mg by mouth daily as needed.        . B Complex Vitamins SOLN Take by mouth daily. Equiv to B 12 1200 mg.       . cetirizine (ZYRTEC) 10 MG tablet Take 10 mg by mouth daily.        . Cholecalciferol (VITAMIN D3) 1000 UNITS CAPS Take 1 capsule by mouth daily.        . Cholecalciferol (VITAMIN D3) 2000 UNITS capsule Take 2,000 Units by mouth daily.        . Cyanocobalamin (VITAMIN B 12 PO) Take by mouth  daily.        . fluticasone (FLONASE) 50 MCG/ACT nasal spray 2 sprays by Nasal route daily.  16 g  11  . gabapentin (NEURONTIN) 300 MG capsule Take 1 capsule (300 mg total) by mouth 2 (two) times daily as needed.  60 capsule  11  . gabapentin (NEURONTIN) 300 MG capsule 2 pills by mouth at bedtime  60 capsule  11  . Glycerin-Polysorbate 80 (REFRESH DRY EYE THERAPY) 1-1 % SOLN Apply to eye as needed.        . hydrochlorothiazide 25 MG tablet Take 1 tablet (25 mg total) by mouth every morning.  30 tablet  11  . Multiple Vitamin (MULTIVITAMIN) tablet Take 1 tablet by mouth daily.        . Multiple Vitamins-Minerals (OCUVITE PO) Take two tablets in am and two tabs in pm       . neomycin-colistin-hydrocortisone-thonzonium (CORTISPORIN TC) 3.08-01-08-0.5 MG/ML otic suspension Use 4 drops in affected ear 3-4 times per day       . nitroGLYCERIN (NITROSTAT) 0.4 MG SL tablet Place 0.4 mg under the tongue as needed.        . Omega-3 Fatty Acids (FISH OIL) 1000 MG CAPS Take 1 capsule by mouth daily with breakfast.               Review of Systems Review of Systems  Constitutional: Negative  for fever, appetite change, fatigue and unexpected weight change.  Eyes: Negative for pain and visual disturbance.  Respiratory: Negative for cough and shortness of breath.   Cardiovascular: Negative for cp or palpitations    Gastrointestinal: Negative for nausea, diarrhea and constipation.  Genitourinary: pos for urgency/ frequency/ bladder discomfort/ neg for blood in urine or flank pain or fever  Skin: Negative for pallor or rash   MSK pos for chronic arthritis pain  Neurological: Negative for weakness, light-headedness,and headaches. pos for uncomfortable neuropathy in hands and feet  Hematological: Negative for adenopathy. Does not bruise/bleed easily.  Psychiatric/Behavioral: Negative for dysphoric mood. The patient is anxious        Objective:   Physical Exam  Constitutional: She appears well-developed and well-nourished. No distress.       Frail appearing elderly female who sleeps at intervals   10 lb wt loss noted   HENT:  Head: Normocephalic and atraumatic.  Mouth/Throat: Oropharynx is clear and moist.  Eyes: Conjunctivae and EOM are normal. Pupils are equal, round, and reactive to light. No scleral icterus.  Neck: Normal range of motion. No JVD present. Carotid bruit is not present.  Cardiovascular: Normal rate and regular rhythm.   Murmur heard. Pulmonary/Chest: Effort normal and breath sounds normal. No respiratory distress. She has no wheezes.  Abdominal: Soft. Bowel sounds are normal. She exhibits no distension and no mass. There is tenderness. There is no rebound and no guarding.       Mild suprapubic tenderness   Musculoskeletal: She exhibits tenderness. She exhibits no edema.       Generalized tenderness / OA findings in hands/ feet   No CVA tenderness  Lymphadenopathy:    She has no cervical adenopathy.  Neurological: She has normal reflexes. She exhibits normal muscle tone. Coordination normal.       Poor short term memory, and pt confuses easily Sleeps at  intervals   Skin: Skin is warm and dry. No rash noted. No erythema. No pallor.  Psychiatric:       Sleeps at intervals and more confused than usual  Assessment & Plan:

## 2011-09-21 NOTE — Assessment & Plan Note (Signed)
This was briefly imp after tx with cipro from [facility]- now urinary symptoms and confusion have returned  Pos ua - not enough for cx Given supplies to bring specimen back for culture  Then will begin keflex 500 mg bid  Adv to update if worse or not improving

## 2011-09-21 NOTE — Assessment & Plan Note (Signed)
Worse with uti - disc enc meals/ snacks/ - expect improvement after she is treated for uti  Will f/u to disc  Also has geriatric failure to thrive

## 2011-09-21 NOTE — Assessment & Plan Note (Signed)
Was briefly imp with snacks/ sweets avail to her -but worse with current uti  Will tx this and then enc restorative feeding again F/u 2-3 wk

## 2011-09-21 NOTE — Patient Instructions (Signed)
Encourage good fluid intake  Give keflex twice daily for 7 days  Try to encourage meals/ snacks  Follow up with me in about 2-3 weeks  Bring urine for culture as soon as you can - refrig it and then bring it in  Get the urine before first dose of antibiotic

## 2011-09-22 NOTE — Progress Notes (Signed)
Addended by: Gilmer Mor on: 09/22/2011 08:09 AM   Modules accepted: Orders

## 2011-10-19 ENCOUNTER — Ambulatory Visit (INDEPENDENT_AMBULATORY_CARE_PROVIDER_SITE_OTHER): Payer: Medicare Other | Admitting: Family Medicine

## 2011-10-19 ENCOUNTER — Encounter: Payer: Self-pay | Admitting: Family Medicine

## 2011-10-19 ENCOUNTER — Telehealth: Payer: Self-pay | Admitting: Family Medicine

## 2011-10-19 VITALS — BP 130/60 | HR 84 | Temp 99.2°F | Wt 139.0 lb

## 2011-10-19 DIAGNOSIS — N390 Urinary tract infection, site not specified: Secondary | ICD-10-CM

## 2011-10-19 DIAGNOSIS — F411 Generalized anxiety disorder: Secondary | ICD-10-CM

## 2011-10-19 DIAGNOSIS — R410 Disorientation, unspecified: Secondary | ICD-10-CM

## 2011-10-19 DIAGNOSIS — F29 Unspecified psychosis not due to a substance or known physiological condition: Secondary | ICD-10-CM

## 2011-10-19 MED ORDER — SULFAMETHOXAZOLE-TRIMETHOPRIM 800-160 MG PO TABS: 1.0000 | ORAL_TABLET | Freq: Two times a day (BID) | ORAL | Status: AC

## 2011-10-19 NOTE — Patient Instructions (Addendum)
It was nice to meet you. We are starting Bactrim- 1 tablet twice daily for 10 days. Please leave a urine sample for [place] at your convenience. Please also follow up with Dr. Milinda Antis this week.

## 2011-10-19 NOTE — Telephone Encounter (Signed)
Per Jeraldine Loots RN  Caller: Joyce/Child; PCP: Roxy Manns A.; CB#: [phone]; Having recurrent urinary sx.  Completed meds for same 2 weeks ago.  Is weaker all over.  Has to have help with ambulation.  Same sx as before.  The urine is dark in color.   Needs to be seen per Urinary Symptoms protocal.  Scheduled for 345p with Dr. Dayton Martes.  Encouraged to push fluids.

## 2011-10-19 NOTE — Progress Notes (Signed)
Subjective:    Patient ID: Carolyn Farley, female    DOB: [birth year], 78 y.o.   MRN: [id number]  HPI 78 yo pt of Dr. Milinda Antis, new to me, here for possible UTI.  Notes reviewed. Was in [facility] last month- had UTI. Returned to see Dr. Milinda Antis and still had some urinary symptoms- UA was positive for RBCs and LEs. Treated with cipro at [facility] and subsequently with Keflex by Dr. Milinda Antis in April.    Daughter felt like she was "back to her old self" last week.  Past few days, shaky again.  More confused and needing help with ambulating. These were her symptoms last time she had a UTI.   No dysuria.  Has been having decreased appetite and declined noted per her PCP, FTT.  Patient Active Problem List  Diagnoses  . MELANOMA  . LIPOMA OF OTHER SKIN AND SUBCUTANEOUS TISSUE  . UNSPECIFIED VITAMIN D DEFICIENCY  . HYPERLIPIDEMIA  . ANEMIA  . ANXIETY  . CARPAL TUNNEL SYNDROME  . NEUROPATHY  . EUSTACHIAN TUBE DYSFUNCTION, CHRONIC  . HEARING LOSS  . HYPERTENSION  . CHRONIC RHINITIS  . COPD  . OVERACTIVE BLADDER  . OSTEOARTHRITIS, HIPS, BILATERAL  . MURMUR  . URINARY INCONTINENCE, MIXED  . Macular degeneration  . Fatigue  . Loss of appetite  . Gait disorder  . Frequent falls  . Adult failure to thrive  . Advanced directives, counseling/discussion  . UTI (lower urinary tract infection)   Past Medical History  Diagnosis Date  . Macular degeneration   . Bell's palsy   . Allergic rhinitis   . Anxiety   . COPD (chronic obstructive pulmonary disease)   . Hyperlipidemia   . Hypertension   . Neuropathy   . Vitamin d deficiency   . Myringotomy tube status     EDT  . Melanoma of back    Past Surgical History  Procedure Date  . Total knee arthroplasty     X 2  . Lumbar disc surgery     LS  . Appendectomy   . Corneal transplant   . Myringotomy     tube R ear   History  Substance Use Topics  . Smoking status: Never Smoker   . Smokeless tobacco: Not on file  . Alcohol Use: Not on  file   Family History  Problem Relation Age of Onset  . Heart disease Mother     ? heart problems  . Hypertension Mother   . Cancer Father     ? bone  . Cancer Daughter     Breast   Allergies  Allergen Reactions  . Celecoxib     REACTION: achy  . Lisinopril     REACTION: high K level  . Nsaids     REACTION: myalgia   Current Outpatient Prescriptions on File Prior to Visit  Medication Sig Dispense Refill  . aspirin 325 MG tablet Take 325 mg by mouth daily as needed.        . B Complex Vitamins SOLN Take by mouth daily. Equiv to B 12 1200 mg.       . cetirizine (ZYRTEC) 10 MG tablet Take 10 mg by mouth daily.        . Cholecalciferol (VITAMIN D3) 1000 UNITS CAPS Take 1 capsule by mouth daily.        . Cholecalciferol (VITAMIN D3) 2000 UNITS capsule Take 2,000 Units by mouth daily.        . Cyanocobalamin (VITAMIN B 12 PO)  Take by mouth daily.        . fluticasone (FLONASE) 50 MCG/ACT nasal spray 2 sprays by Nasal route daily.  16 g  11  . gabapentin (NEURONTIN) 300 MG capsule Take 1 capsule (300 mg total) by mouth 2 (two) times daily as needed.  60 capsule  11  . gabapentin (NEURONTIN) 300 MG capsule 2 pills by mouth at bedtime  60 capsule  11  . Glycerin-Polysorbate 80 (REFRESH DRY EYE THERAPY) 1-1 % SOLN Apply to eye as needed.        . hydrochlorothiazide 25 MG tablet Take 1 tablet (25 mg total) by mouth every morning.  30 tablet  11  . Multiple Vitamin (MULTIVITAMIN) tablet Take 1 tablet by mouth daily.        . Multiple Vitamins-Minerals (OCUVITE PO) Take two tablets in am and two tabs in pm       . neomycin-colistin-hydrocortisone-thonzonium (CORTISPORIN TC) 3.08-01-08-0.5 MG/ML otic suspension Use 4 drops in affected ear 3-4 times per day       . nitroGLYCERIN (NITROSTAT) 0.4 MG SL tablet Place 0.4 mg under the tongue as needed.        . Omega-3 Fatty Acids (FISH OIL) 1000 MG CAPS Take 1 capsule by mouth daily with breakfast.           Review of Systems  See HPI         Objective:   Physical Exam  BP 130/60  Pulse 84  Temp 99.2 F (37.3 C)  Wt 139 lb (63.05 kg)  LMP 06/01/1966 Wt Readings from Last 3 Encounters:  10/19/11 139 lb (63.05 kg)  09/21/11 142 lb 12 oz (64.751 kg)  09/01/11 152 lb 4 oz (69.06 kg)   Constitutional: She appears well-developed and well-nourished. No distress.   Frail appearing elderly female who sleeps at intervals  4 pound weight loss since 4/22 HENT:  Head: Normocephalic and atraumatic.  Mouth/Throat: Oropharynx is clear and moist.  Pulmonary/Chest: Effort normal and breath sounds normal. No respiratory distress. She has no wheezes.  Abdominal: Soft. Bowel sounds are normal. She exhibits no distension and no mass. There is tenderness. There is no rebound and no guarding.       Mild suprapubic tenderness     Mild right CVA tenderness  Lymphadenopathy:    She has no cervical adenopathy.  Neurological: She has normal reflexes. She exhibits normal muscle tone. Coordination normal.   can't seem to sit still- legs are constantly moving Skin: Skin is warm and dry. No rash noted. No erythema. No pallor.     Assessment & Plan:   1. Confusion  >45 min spent with face to face with patient, >50% counseling and/or coordinating care. Likely multifactorial. Unable to leave urine sample, so given hat and specimen container to take home. Start Bactrim today (dose to cover for pyelo)- per pt's daughter, no known sulfa allergy. She is to follow up with Dr. Milinda Antis this week.   2. UTI (lower urinary tract infection)  See above.

## 2011-10-20 LAB — POCT URINALYSIS DIPSTICK
Glucose, UA: NEGATIVE
Ketones, UA: NEGATIVE
Spec Grav, UA: 1.01

## 2011-10-20 NOTE — Progress Notes (Signed)
Addended by: Gilmer Mor on: 10/20/2011 11:15 AM   Modules accepted: Orders

## 2011-10-23 ENCOUNTER — Telehealth: Payer: Self-pay | Admitting: Family Medicine

## 2011-10-23 NOTE — Telephone Encounter (Signed)
Please check in and see how she is doing - thanks

## 2011-10-23 NOTE — Telephone Encounter (Signed)
Triage Record Num: [id number] Operator: Malachi Paradise Patient Name: Carolyn Farley Call Date & Time: 10/22/2011 5:43:52PM Patient Phone: [phone] PCP: Audrie Gallus. Tower Patient Gender: Female PCP Fax : Patient DOB: [birth year] Practice Name: [facility] Reason for Call: Caller: Joyce/Child; PCP; Tower, Marne A.; CB#: [phone]; Dehydration; Onset 10/22/11 intermittenly no eating, decreased fluid intake. Pt's daughter states pt states "wants to go to nursing home and feels like it is to much of a problem for the family." All emergent s/s r/o per Dehydration protocol with exception "All other siuations." Home Care advise given. Protocol(s) Used: Dehydration Recommended Outcome per Protocol: Provide Home/Self Care Reason for Outcome: All other situations Care Advice: ~ Perform any strenuous activities early in the morning when temperature and relative humidity are likely to be lower. ~ Call provider if symptoms continue, worsen, or new symptoms develop. ~ SYMPTOM / CONDITION MANAGEMENT Most adults need to drink 6-10 eight-ounce glasses (1.2-2.0 liters) of fluids per day unless previously told to limit fluid intake for other medical reasons. Limit fluids that contain caffeine, sugar or alcohol. Urine will be a very light yellow color when you drink enough fluids. ~ Go to the ED if you have developed signs and symptoms of dehydration such as very dry mouth and tongue; increased pulse rate at rest; no urine output for 8 hours or more; increasing weakness or drowsiness, or lightheadedness when trying to sit upright or standing. ~ ~ Move to cool environment if person has possible heat illness. HEAT EXPOSURE CARE: - Stop activity until feeling better. - Rest in a cool environment. - If not vomiting, drink a commercial oral rehydration solution (such as Rehydralyte) or a diluted sports drink; if not available, drink water. - Avoid alcohol intake before, during and after  activity/exposure. - Do not take salt tablets; may cause upset stomach, nausea or vomiting. Salt added to food provides adequate amounts. - May need continued rest for 1 to 3 days before returning to work or resuming heavy activity in the sun. ~ 10/22/2011 5:53:43PM Page 1 of 1 [email or web address]

## 2011-10-23 NOTE — Telephone Encounter (Signed)
Left message on number given for Carolyn Farley to return call.

## 2011-10-23 NOTE — Telephone Encounter (Signed)
Spoke with Carolyn Farley about patient and she stated that patient is still not eating.  Carolyn Farley made an appt for her mom to see Dr. Milinda Antis for Tuesday.

## 2011-10-27 ENCOUNTER — Encounter: Payer: Self-pay | Admitting: Family Medicine

## 2011-10-27 ENCOUNTER — Ambulatory Visit (INDEPENDENT_AMBULATORY_CARE_PROVIDER_SITE_OTHER): Payer: Medicare Other | Admitting: Family Medicine

## 2011-10-27 VITALS — BP 120/72 | HR 89 | Temp 98.8°F | Ht 60.0 in | Wt 139.2 lb

## 2011-10-27 DIAGNOSIS — R410 Disorientation, unspecified: Secondary | ICD-10-CM

## 2011-10-27 DIAGNOSIS — R627 Adult failure to thrive: Secondary | ICD-10-CM

## 2011-10-27 DIAGNOSIS — N390 Urinary tract infection, site not specified: Secondary | ICD-10-CM

## 2011-10-27 DIAGNOSIS — R300 Dysuria: Secondary | ICD-10-CM

## 2011-10-27 DIAGNOSIS — F29 Unspecified psychosis not due to a substance or known physiological condition: Secondary | ICD-10-CM

## 2011-10-27 LAB — POCT URINALYSIS DIPSTICK
Bilirubin, UA: NEGATIVE
Glucose, UA: NEGATIVE
Ketones, UA: NEGATIVE

## 2011-10-27 MED ORDER — CEPHALEXIN 250 MG PO CAPS: 250.0000 mg | ORAL_CAPSULE | Freq: Two times a day (BID) | ORAL | Status: AC

## 2011-10-27 NOTE — Assessment & Plan Note (Signed)
This is becoming more chronic - with advanced age and frequent utis At this point I do think that this (in combination with blindness/ deafness - makes her incompetent to endorse checks/ do her finances and that her POA should take over /perhaps become a gaurdian  They are working with a Clinical research associate on this project and pt is very much in agreement

## 2011-10-27 NOTE — Patient Instructions (Signed)
Talk to your family about placement- either nursing home or hospice and let me know what you decide  Start on the keflex for urinary infection I will make a further plan after urine culture result returns

## 2011-10-27 NOTE — Progress Notes (Signed)
Subjective:    Patient ID: Carolyn Farley, female    DOB: [birth year], 78 y.o.   MRN: [id number]  HPI Here with uti and  Having a lot of ups and downs   Says she is miserable all the time - pain and no energy Appetite is really up and down   ua is pos today   One antibiotic made her feel sick- sulfa / bactrim- agitated and felt badly on it   Legs shake a lot lately ? If worse when she feels bad   No burning with urination per pt  Has frequency- that varies  Is drinking more the last 3 days and also hungry today  Daughter is here for power of attouney Needs to be deemed incompetent and unable to sign checks  She wants to make her POA her gaurdian   Getting help at home 3d per week  Family there 2 days  Daughter is home the weekend   Is checking into info about hospice and also nursing homes  Family will be supportive with what the patient wants   Patient Active Problem List  Diagnoses  . MELANOMA  . LIPOMA OF OTHER SKIN AND SUBCUTANEOUS TISSUE  . UNSPECIFIED VITAMIN D DEFICIENCY  . HYPERLIPIDEMIA  . ANEMIA  . ANXIETY  . CARPAL TUNNEL SYNDROME  . NEUROPATHY  . EUSTACHIAN TUBE DYSFUNCTION, CHRONIC  . HEARING LOSS  . HYPERTENSION  . CHRONIC RHINITIS  . COPD  . OVERACTIVE BLADDER  . OSTEOARTHRITIS, HIPS, BILATERAL  . MURMUR  . URINARY INCONTINENCE, MIXED  . Macular degeneration  . Fatigue  . Loss of appetite  . Gait disorder  . Frequent falls  . Adult failure to thrive  . Advanced directives, counseling/discussion  . UTI (lower urinary tract infection)  . Confusion   Past Medical History  Diagnosis Date  . Macular degeneration   . Bell's palsy   . Allergic rhinitis   . Anxiety   . COPD (chronic obstructive pulmonary disease)   . Hyperlipidemia   . Hypertension   . Neuropathy   . Vitamin d deficiency   . Myringotomy tube status     EDT  . Melanoma of back    Past Surgical History  Procedure Date  . Total knee arthroplasty     X 2  . Lumbar  disc surgery     LS  . Appendectomy   . Corneal transplant   . Myringotomy     tube R ear   History  Substance Use Topics  . Smoking status: Never Smoker   . Smokeless tobacco: Not on file  . Alcohol Use: Not on file   Family History  Problem Relation Age of Onset  . Heart disease Mother     ? heart problems  . Hypertension Mother   . Cancer Father     ? bone  . Cancer Daughter     Breast   Allergies  Allergen Reactions  . Celecoxib     REACTION: achy  . Lisinopril     REACTION: high K level  . Nsaids     REACTION: myalgia   Current Outpatient Prescriptions on File Prior to Visit  Medication Sig Dispense Refill  . aspirin 325 MG tablet Take 325 mg by mouth daily as needed.        . B Complex Vitamins SOLN Take by mouth daily. Equiv to B 12 1200 mg.       . cetirizine (ZYRTEC) 10 MG tablet Take 10 mg  by mouth daily.        . Cholecalciferol (VITAMIN D3) 1000 UNITS CAPS Take 1 capsule by mouth daily.        . Cholecalciferol (VITAMIN D3) 2000 UNITS capsule Take 2,000 Units by mouth daily.        . Cyanocobalamin (VITAMIN B 12 PO) Take by mouth daily.        . fluticasone (FLONASE) 50 MCG/ACT nasal spray 2 sprays by Nasal route daily.  16 g  11  . gabapentin (NEURONTIN) 300 MG capsule Take 1 capsule (300 mg total) by mouth 2 (two) times daily as needed.  60 capsule  11  . gabapentin (NEURONTIN) 300 MG capsule 2 pills by mouth at bedtime  60 capsule  11  . Glycerin-Polysorbate 80 (REFRESH DRY EYE THERAPY) 1-1 % SOLN Apply to eye as needed.        . hydrochlorothiazide 25 MG tablet Take 1 tablet (25 mg total) by mouth every morning.  30 tablet  11  . Multiple Vitamin (MULTIVITAMIN) tablet Take 1 tablet by mouth daily.        . Multiple Vitamins-Minerals (OCUVITE PO) Take two tablets in am and two tabs in pm       . neomycin-colistin-hydrocortisone-thonzonium (CORTISPORIN TC) 3.08-01-08-0.5 MG/ML otic suspension Use 4 drops in affected ear 3-4 times per day       .  nitroGLYCERIN (NITROSTAT) 0.4 MG SL tablet Place 0.4 mg under the tongue as needed.        . Omega-3 Fatty Acids (FISH OIL) 1000 MG CAPS Take 1 capsule by mouth daily with breakfast.        . sulfamethoxazole-trimethoprim (BACTRIM DS,SEPTRA DS) 800-160 MG per tablet Take 1 tablet by mouth 2 (two) times daily.  20 tablet  0         Review of Systems Review of Systems  Constitutional: Negative for fever, appetite change, fatigue and unexpected weight change. (weight is stable this visit) Eyes: Negative for pain and pos for blindness  ENT pos for deafness Respiratory: Negative for cough and shortness of breath.   Cardiovascular: Negative for cp or palpitations    Gastrointestinal: Negative for nausea, diarrhea and constipation.  Genitourinary: Negative for urgency and frequency.  Skin: Negative for pallor or rash   MSK pos for chronic pain in legs and also neuropathy  Neurological: Negative for weakness, light-headedness, numbness and headaches. pos for tremor in hands and legs when she gets anxious  Hematological: Negative for adenopathy. Does not bruise/bleed easily.  Psychiatric/Behavioral:  Pos for dysphoric mood.intermittently The patient is anxious         Objective:   Physical Exam  Constitutional: She appears well-developed and well-nourished. No distress.       Frail appearing elderly female who is blind and also has hearing loss   HENT:  Head: Normocephalic and atraumatic.  Mouth/Throat: Oropharynx is clear and moist.  Eyes: Conjunctivae and EOM are normal. Pupils are equal, round, and reactive to light. Right eye exhibits no discharge. Left eye exhibits no discharge.  Neck: Normal range of motion. Neck supple. No JVD present. Carotid bruit is not present. No thyromegaly present.  Cardiovascular: Normal rate, regular rhythm, normal heart sounds and intact distal pulses.  Exam reveals no gallop.   Pulmonary/Chest: Effort normal and breath sounds normal. No respiratory  distress. She has no wheezes.       No crackles   Abdominal: Bowel sounds are normal. She exhibits no abdominal bruit and no mass.  No suprapubic tenderness    Musculoskeletal: She exhibits tenderness (OA in hands/ feet/ knees with tenderness).       No cva tenderness   Lymphadenopathy:    She has no cervical adenopathy.  Neurological: She is alert. No cranial nerve deficit. Coordination abnormal.  Skin: Skin is warm and dry. No rash noted. No erythema. No pallor.       Fair complexion  Psychiatric: Her speech is normal. Her mood appears anxious. She is slowed. Cognition and memory are impaired. She exhibits a depressed mood. She expresses no homicidal and no suicidal ideation. She exhibits abnormal recent memory.       Seems depressed and anxious  Voices desire to die soon since she is so old and suffering so much  Admits she is unable to make major decisions any longer or write checks, and wants daughter to do it  Also voices desire to go into hospice or a nursing home           Assessment & Plan:

## 2011-10-27 NOTE — Assessment & Plan Note (Signed)
Advanced age/ recurrent uti/ chronic pain - pt wants to go into facility , she would be appropriate for hospice POA will disc with rest of family and get back to me about it

## 2011-10-27 NOTE — Assessment & Plan Note (Signed)
ua pos today-sent for cx but not enough spec volume to spin  tx with keflex that worked well in past May need to stay on small dose for prophylaxis  Family will update me re: her status and will adv about urine cx when it returns

## 2011-10-28 ENCOUNTER — Encounter: Payer: Self-pay | Admitting: Family Medicine

## 2011-10-28 ENCOUNTER — Telehealth: Payer: Self-pay

## 2011-10-28 ENCOUNTER — Telehealth: Payer: Self-pay | Admitting: *Deleted

## 2011-10-28 DIAGNOSIS — R410 Disorientation, unspecified: Secondary | ICD-10-CM

## 2011-10-28 DIAGNOSIS — R627 Adult failure to thrive: Secondary | ICD-10-CM

## 2011-10-28 NOTE — Telephone Encounter (Signed)
Yes I am ok with that.  I will ask Jacki Cones to print out for me and I will sign tomorrow. Thanks.

## 2011-10-28 NOTE — Telephone Encounter (Signed)
Hospice ref done  

## 2011-10-28 NOTE — Telephone Encounter (Signed)
Carolyn Farley with [facility] [phone] ; pts daughter, Carolyn Farley request referral for [facility] to be done.

## 2011-10-28 NOTE — Telephone Encounter (Signed)
Thanks ! Please let pt's daughter know that Dr Dayton Martes will write another letter

## 2011-10-28 NOTE — Telephone Encounter (Signed)
Alona Bene advised as instructed via telephone.

## 2011-10-28 NOTE — Telephone Encounter (Signed)
Pt's daughter spoke to lawyer re POA and was told 2 doctors need to sigh the POA paper you wrote. She says it can be any doctor in the office not necessarily someone she has seen, but she saw Dr Dayton Martes last week and wanted to see if she could sign it. She request you do a new paper and sign as well as passing it on another Dr to sign.

## 2011-10-28 NOTE — Telephone Encounter (Signed)
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 --she has no other doctors whatsoever  Let me know what you think The letter I wrote is in the chart, thanks  --Idamae Schuller

## 2011-10-29 ENCOUNTER — Encounter: Payer: Self-pay | Admitting: Family Medicine

## 2011-10-29 ENCOUNTER — Telehealth: Payer: Self-pay | Admitting: Family Medicine

## 2011-10-29 DIAGNOSIS — R410 Disorientation, unspecified: Secondary | ICD-10-CM

## 2011-10-29 DIAGNOSIS — J449 Chronic obstructive pulmonary disease, unspecified: Secondary | ICD-10-CM

## 2011-10-29 DIAGNOSIS — R627 Adult failure to thrive: Secondary | ICD-10-CM

## 2011-10-29 NOTE — Telephone Encounter (Signed)
Message copied by Judy Pimple on Thu Oct 29, 2011  2:46 PM ------      Message from: Carlton Adam      Created: Thu Oct 29, 2011  9:28 AM      Regarding: Hospice Referral       Dr Milinda Antis, I spoke to [place] with Hospice about this patients referral. She said that new guidelines cant use a Primary diagnosis of Failure to thrive at all anymore. She asked you to use COPD as the primary DX and failure to thrive and confusion as secondary and third. Can you please put a new Hospice referral in using the new Primary DX. Thanks, Shirlee Limerick

## 2011-10-30 LAB — URINE CULTURE: Colony Count: 8000

## 2011-11-02 ENCOUNTER — Telehealth: Payer: Self-pay | Admitting: *Deleted

## 2011-11-02 NOTE — Telephone Encounter (Signed)
Tammy from Hospice called and requested medication for patient for anxiety and depression. Please advise.

## 2011-11-02 NOTE — Telephone Encounter (Signed)
Tammy called (left message) re meds for pt, I called back and got voicemail, I left message instructing her to call office back.

## 2011-11-03 MED ORDER — SERTRALINE HCL 25 MG PO TABS: 25.0000 mg | ORAL_TABLET | Freq: Every day | ORAL | Status: DC

## 2011-11-03 NOTE — Telephone Encounter (Signed)
Will try zoloft - lets start low with 25 mg to see how she does Px written for call in   Update if side eff or problems If doing ok at 2 weeks - call and will adv to 50 mg

## 2011-11-03 NOTE — Telephone Encounter (Signed)
Tammy at [facility] notified as instructed by telephone. Rx called to [facility] as requested.

## 2011-11-16 DIAGNOSIS — J449 Chronic obstructive pulmonary disease, unspecified: Secondary | ICD-10-CM

## 2011-12-02 ENCOUNTER — Telehealth: Payer: Self-pay

## 2011-12-02 MED ORDER — GABAPENTIN 300 MG PO CAPS: ORAL_CAPSULE | ORAL | Status: AC

## 2011-12-02 NOTE — Telephone Encounter (Signed)
I changed her gabapentin for call in  She has copd, loss of appetite/ geriatric failure to thrive/ dementia/ frequent falls/ widespread pain with OA -- would any of these suffice?

## 2011-12-02 NOTE — Telephone Encounter (Signed)
Tammy with Hospice said pt is presently taking Gabapentin 300 mg two tabs at hs. Pt having numbness and tingling in fingers and pain rt hip down rt leg. Tammy request Increase Gabapentin pt taking 1 in AM and 2 @ hs. Tammy also needs more diagnosis for hospice care. Pt admitted to hospice care with COPD diagnosis; pt not on O2 and does not get SOB when walking.Please advise.

## 2011-12-02 NOTE — Telephone Encounter (Signed)
Tammy with [facility] request increase in pts Gabapentin. Left v/m for Tammy to call back to verify how pt taking med now and why need for increase.

## 2011-12-02 NOTE — Telephone Encounter (Signed)
Called in Rx as directed. Left vm for Tammy to call me.

## 2011-12-04 NOTE — Telephone Encounter (Signed)
Carolyn Farley with [facility] left v/m to call her back at [phone].

## 2011-12-15 NOTE — Telephone Encounter (Signed)
I listed them on 7/3 -- she also has advanced age, weight loss and decrease in appetite, geriatric failure to thrive, dementia, frequent falls with homebound status and inability to ambulate, and also OA with widespread pain that is progressive  Let me know if none of these dx qualify her for hospice please

## 2011-12-15 NOTE — Telephone Encounter (Signed)
Left message on Tammy vm to call me regarding Dx code for patient.

## 2011-12-15 NOTE — Telephone Encounter (Signed)
Spoke with Carolyn Farley verified that Rx was picked up and correct. Carolyn Farley has concerns with patients Dx, she is Dx with COPD but according to Carolyn Farley she has no lung problems. Carolyn Farley needs to know what do you feel is appropriate to Dx her with.

## 2011-12-16 NOTE — Telephone Encounter (Signed)
Left message on Carolyn Farley vm @[phone] with instructions provided below. Hospice may call back.

## 2012-01-04 ENCOUNTER — Telehealth: Payer: Self-pay

## 2012-01-04 NOTE — Telephone Encounter (Signed)
Carolyn Farley with [facility]; pt discharged from hospice services on 01/05/12 due to stability. This is an update. Carolyn Farley faxed over physicians update form also.

## 2012-01-04 NOTE — Telephone Encounter (Signed)
Aware, thanks!

## 2012-02-19 ENCOUNTER — Other Ambulatory Visit: Payer: Self-pay | Admitting: Family Medicine

## 2012-02-19 NOTE — Telephone Encounter (Signed)
Ok to refill 

## 2012-02-19 NOTE — Telephone Encounter (Signed)
Will refill electronically  

## 2012-05-02 ENCOUNTER — Telehealth: Payer: Self-pay | Admitting: Family Medicine

## 2012-05-02 NOTE — Telephone Encounter (Signed)
Daughter calling, states that she spoke with someone Friday about her mom's pain medication, Hydrocodone.  It isn't helping the pain and she doesn't have enough to last the month.  States that she has been discharged from Hospice and they were the ones who got her medication.  Denies any specific area other then her right side pain when she walks. She cannot bring her in to the office due to her age and decreased mobility.  Would like to know if a stronger medication is indicated? She can be reached at [phone].

## 2012-05-03 MED ORDER — HYDROCODONE-ACETAMINOPHEN 5-500 MG PO TABS: 1.0000 | ORAL_TABLET | Freq: Four times a day (QID) | ORAL | Status: DC | PRN

## 2012-05-03 NOTE — Telephone Encounter (Signed)
Pt's daughter agrees with increasing her dose to q6hr prn, I did advise that someone should be there with her because it can increase her fall risk, pt is almost out of med so needs refill of med sent to pharm on file

## 2012-05-03 NOTE — Telephone Encounter (Signed)
Px written for call in   Thanks Please remind her also that constipation is a side effect of almost all pain meds - she may need stool softener regularly

## 2012-05-03 NOTE — Telephone Encounter (Signed)
Please ask her about med/ strength/ dosage and how often she had med -thanks

## 2012-05-03 NOTE — Telephone Encounter (Signed)
Spoke with Alona Bene (pt's daughter) and pt is taking Hydrocodone/ APAP 5/500mg  BID, daughter only gives it to her twice a day because she only gives it to her when someone is there due to fall risk but her daughter said that pt is still complaining that she is in pain, please advise

## 2012-05-03 NOTE — Telephone Encounter (Signed)
We can certainly increase frequency of dosage - understanding that all pain meds at her age increase fall risk - is someone there all the time? What are her thoughts ? We could go up to every 6 hours if needed with supervision

## 2012-05-03 NOTE — Telephone Encounter (Signed)
Rx called in as prescribed Daughter notified and advise that it may cause constipation so pt may need stool softener

## 2012-06-06 ENCOUNTER — Telehealth: Payer: Self-pay

## 2012-06-06 NOTE — Telephone Encounter (Signed)
That is fine with me - please get some info from family re: change in her status -- symptoms/ conditions/ breathing/ nutrition (ability to eat)- just give me an update- thanks

## 2012-06-06 NOTE — Telephone Encounter (Signed)
Denise with [facility] left v/m pts daughter,Joyce request hospice services for pt. Denise request call back.

## 2012-06-06 NOTE — Telephone Encounter (Signed)
Left voicemail requesting pt to call office, will try to call back tomorrow

## 2012-06-07 NOTE — Telephone Encounter (Signed)
Carolyn Farley- can you take a look at this note? -pt is 75 (advanced age) with intractable arthritis pain and can no longer walk to come in to the office ..do you think she would be a hospice candidate- or ins would cover? Thanks- let me know

## 2012-06-07 NOTE — Telephone Encounter (Signed)
Pt's daughter said that the pain meds that you prescribed are not helping even with increasing the frequency. Daughter said pt is in pain all the time to the point she can't walk anymore. Pt said that her right knee is hurting all the time an she has an old scar on it from a past surgery. Pt says that her whole right side from her foot to her bottom is hurting. Pt hasn't had any falls or injuries to explain why she is in so much pain. Daughter said she is still eating and breathing good but daughter can't get her here for an appt because she no longer walks

## 2012-06-08 NOTE — Telephone Encounter (Signed)
Faxed Referral to [facility] Attn Angelique Blonder. They will do a Face to Face with the patient to see if she is Hospice appropriate and will let Dr Milinda Antis know. Hospice has been in contact with daughter Hortencia Pilar already, spoke to [place].

## 2012-06-09 NOTE — Telephone Encounter (Signed)
Denise with [facility] said pt was not admitted to hospice care; no hospice appropriate at this time. If need to speak with [facility] call [phone].

## 2012-06-11 ENCOUNTER — Other Ambulatory Visit: Payer: Self-pay | Admitting: Family Medicine

## 2012-06-13 MED ORDER — HYDROCODONE-ACETAMINOPHEN 5-325 MG PO TABS: 1.0000 | ORAL_TABLET | Freq: Four times a day (QID) | ORAL | Status: DC | PRN

## 2012-06-13 NOTE — Telephone Encounter (Signed)
See prev notation

## 2012-06-13 NOTE — Telephone Encounter (Signed)
Rx called in as prescribed 

## 2012-06-13 NOTE — Telephone Encounter (Signed)
Dose no longer made, please advise

## 2012-06-13 NOTE — Telephone Encounter (Signed)
Ok to refill 

## 2012-06-13 NOTE — Telephone Encounter (Signed)
Oh yes, I keep forgetting that... Here is a new px- please call in for norco (it has less tylenol in it)

## 2012-06-13 NOTE — Telephone Encounter (Signed)
Px written for call in   

## 2012-08-19 ENCOUNTER — Other Ambulatory Visit: Payer: Self-pay | Admitting: Family Medicine

## 2012-08-19 NOTE — Telephone Encounter (Signed)
Ok to refill 

## 2012-08-21 NOTE — Telephone Encounter (Signed)
Px written for call in   

## 2012-08-22 ENCOUNTER — Ambulatory Visit (INDEPENDENT_AMBULATORY_CARE_PROVIDER_SITE_OTHER): Payer: Medicare Other | Admitting: Family Medicine

## 2012-08-22 ENCOUNTER — Encounter: Payer: Self-pay | Admitting: Family Medicine

## 2012-08-22 VITALS — BP 118/58 | HR 88 | Temp 98.8°F | Ht 60.0 in

## 2012-08-22 DIAGNOSIS — Z9181 History of falling: Secondary | ICD-10-CM

## 2012-08-22 DIAGNOSIS — H3530 Unspecified macular degeneration: Secondary | ICD-10-CM

## 2012-08-22 DIAGNOSIS — M5137 Other intervertebral disc degeneration, lumbosacral region: Secondary | ICD-10-CM

## 2012-08-22 DIAGNOSIS — R5383 Other fatigue: Secondary | ICD-10-CM

## 2012-08-22 DIAGNOSIS — M5136 Other intervertebral disc degeneration, lumbar region: Secondary | ICD-10-CM

## 2012-08-22 DIAGNOSIS — R296 Repeated falls: Secondary | ICD-10-CM

## 2012-08-22 DIAGNOSIS — R269 Unspecified abnormalities of gait and mobility: Secondary | ICD-10-CM

## 2012-08-22 DIAGNOSIS — R627 Adult failure to thrive: Secondary | ICD-10-CM

## 2012-08-22 NOTE — Patient Instructions (Addendum)
I think living in a facility would help with both safety and comfort (pain control) , since advancing pain medicine would increase fall risk - it is more dangerous for you to be at home  I will finish paperwork and you can talk to your family about a plan that would work for you

## 2012-08-22 NOTE — Assessment & Plan Note (Signed)
Ongoing with chronic pain and lack of mobility

## 2012-08-22 NOTE — Assessment & Plan Note (Signed)
With chronic low back pain rad to R leg - pt is more and more uncomfortable with decreasing mobility  Do not feel comfortable inc pain med unless she is in safer env where all ambulation is assisted - as her fall risk is high

## 2012-08-22 NOTE — Telephone Encounter (Signed)
Rx called in as prescribed 

## 2012-08-22 NOTE — Assessment & Plan Note (Signed)
Pt is legally blind and requires help with ADLs incl meal prep and bathing and ambulation

## 2012-08-22 NOTE — Assessment & Plan Note (Signed)
This seems to be stable - appetite improves at intervals, and wt loss has slowed , pt is able to feed herself but not prepare food

## 2012-08-22 NOTE — Progress Notes (Signed)
Subjective:    Patient ID: Carolyn Farley, female    DOB: 01-15-1917, 79 y.o.   MRN: [id number]  HPI Here for follow up of chronic issues  [facility] with [facility] (assisted living if needed)   She has looked into a facility at [facility]- and pt is impartial  ? As to whether home care will be enough  Aware that better pain control means less mobility  No longer qualifies for hospice  Mental state fluctuates Her mobility fluctuates also - and she cannot get up or down on her own   Her right sided pain is getting worse  Pain is chronic - she takes norco - right lower back that radiates into leg- has disc degeneration Her pain control is not adequate - currently- but more pain med would increase fall risk more at home   She is legally blind and needs help with ADLs including bathing/ and food prep (thought can feed herself) She is continent of stool/ but not of urine She is occasionally confused  Appetite comes and goes - with advanced age and geriatric failure to thrive  No urinary symptoms lately except for baseline incontinence    Patient Active Problem List  Diagnosis  . MELANOMA  . LIPOMA OF OTHER SKIN AND SUBCUTANEOUS TISSUE  . UNSPECIFIED VITAMIN D DEFICIENCY  . HYPERLIPIDEMIA  . ANEMIA  . ANXIETY  . CARPAL TUNNEL SYNDROME  . NEUROPATHY  . EUSTACHIAN TUBE DYSFUNCTION, CHRONIC  . HEARING LOSS  . HYPERTENSION  . CHRONIC RHINITIS  . COPD  . OVERACTIVE BLADDER  . OSTEOARTHRITIS, HIPS, BILATERAL  . MURMUR  . URINARY INCONTINENCE, MIXED  . Macular degeneration  . Fatigue  . Loss of appetite  . Gait disorder  . Frequent falls  . Adult failure to thrive  . Advanced directives, counseling/discussion  . UTI (lower urinary tract infection)  . Confusion   Past Medical History  Diagnosis Date  . Macular degeneration   . Bell's palsy   . Allergic rhinitis   . Anxiety   . COPD (chronic obstructive pulmonary disease)   .  Hyperlipidemia   . Hypertension   . Neuropathy   . Vitamin D deficiency   . Myringotomy tube status     EDT  . Melanoma of back    Past Surgical History  Procedure Laterality Date  . Total knee arthroplasty      X 2  . Lumbar disc surgery      LS  . Appendectomy    . Corneal transplant    . Myringotomy      tube R ear   History  Substance Use Topics  . Smoking status: Never Smoker   . Smokeless tobacco: Not on file  . Alcohol Use: No   Family History  Problem Relation Age of Onset  . Heart disease Mother     ? heart problems  . Hypertension Mother   . Cancer Father     ? bone  . Cancer Daughter     Breast   Allergies  Allergen Reactions  . Celecoxib     REACTION: achy  . Lisinopril     REACTION: high K level  . Nsaids     REACTION: myalgia   Current Outpatient Prescriptions on File Prior to Visit  Medication Sig Dispense Refill  . aspirin 325 MG tablet Take 325 mg by mouth daily as needed.        Marland Kitchen B  Complex Vitamins SOLN Take by mouth daily. Equiv to B 12 1200 mg.       . cetirizine (ZYRTEC) 10 MG tablet Take 10 mg by mouth daily.        . Cholecalciferol (VITAMIN D3) 1000 UNITS CAPS Take 1 capsule by mouth daily.        . Cholecalciferol (VITAMIN D3) 2000 UNITS capsule Take 2,000 Units by mouth daily.        . Cyanocobalamin (VITAMIN B 12 PO) Take by mouth daily.        . fluticasone (FLONASE) 50 MCG/ACT nasal spray 2 sprays by Nasal route daily.  16 g  11  . gabapentin (NEURONTIN) 300 MG capsule Take 1 pill by mouth in am and 2 pills by mouth in pm  90 capsule  11  . Glycerin-Polysorbate 80 (REFRESH DRY EYE THERAPY) 1-1 % SOLN Apply to eye as needed.        . hydrochlorothiazide 25 MG tablet Take 1 tablet (25 mg total) by mouth every morning.  30 tablet  11  . HYDROcodone-acetaminophen (NORCO/VICODIN) 5-325 MG per tablet take 1 tablet by mouth every 6 hours if needed for pain (USE WITH CAUTION FOR SEDATION AND FALLS)  30 tablet  3  . Multiple Vitamin  (MULTIVITAMIN) tablet Take 1 tablet by mouth daily.        . Multiple Vitamins-Minerals (OCUVITE PO) Take two tablets in am and two tabs in pm       . neomycin-colistin-hydrocortisone-thonzonium (CORTISPORIN TC) 3.08-01-08-0.5 MG/ML otic suspension Use 4 drops in affected ear 3-4 times per day       . nitroGLYCERIN (NITROSTAT) 0.4 MG SL tablet Place 0.4 mg under the tongue as needed.        . Omega-3 Fatty Acids (FISH OIL) 1000 MG CAPS Take 1 capsule by mouth daily with breakfast.        . sertraline (ZOLOFT) 25 MG tablet take 1 tablet by mouth once daily  30 tablet  11   No current facility-administered medications on file prior to visit.      Review of Systems    Review of Systems  Constitutional: Negative for fever, appetite change, fatigue and unexpected weight change.  Eyes: Negative for pain and visual disturbance.  Respiratory: Negative for cough and shortness of breath.  pos for exercise intolerance Cardiovascular: Negative for cp or palpitations    Gastrointestinal: Negative for nausea, diarrhea and constipation.  Genitourinary: Negative for urgency and pos for baseline frequency as well as incontinence Skin: Negative for pallor or rash   Neurological: Negative for weakness, light-headedness, numbness and headaches. pos for neuropathy that is significant pos for poor balance and frequent falls with requirement of 24 hour care  Hematological: Negative for adenopathy. Does not bruise/bleed easily.  Psychiatric/Behavioral: Negative for dysphoric mood. The patient is nervous/anxious.      Objective:   Physical Exam  Constitutional: She appears well-developed and well-nourished. No distress.  Frail appearing elderly female in wheelchair   HENT:  Head: Normocephalic and atraumatic.  Mouth/Throat: Oropharynx is clear and moist. No oropharyngeal exudate.  Hearing aides noted   Eyes: Conjunctivae and EOM are normal. Pupils are equal, round, and reactive to light. No scleral icterus.   Vision is very poor  Pt's eyes water throughout interview  No facial droop today  Neck: Normal range of motion. Neck supple. No JVD present. Carotid bruit is not present. No tracheal deviation present. No thyromegaly present.  Cardiovascular: Normal rate and regular rhythm.  Murmur heard. Pulmonary/Chest: Effort normal and breath sounds normal. No respiratory distress. She has no wheezes. She has no rales.  No crackles   Abdominal: Soft. Bowel sounds are normal. She exhibits no distension. There is no tenderness.  Musculoskeletal: She exhibits tenderness. She exhibits no edema.  LS tenderness with poor rom - pt remains hunched over  Knees are tender-no swelling OA changes in hands Very poor rom of LE and spine  Pt is unable to raise from chair without assistance today   Gait is unsteady and wide based   Mild tremor   Lymphadenopathy:    She has no cervical adenopathy.  Neurological: She is alert. She has normal reflexes. She displays tremor. She displays no atrophy. A sensory deficit is present. No cranial nerve deficit. She exhibits normal muscle tone. Coordination and gait abnormal.  Wide based gait that is very unsteady Strength of extremeties is symmetric 4/5 in UE and 5/5 LE  Decreased fine motor skills due to tremor and arthritis in hands  Pt is hard of hearing   Skin: Skin is warm and dry. No rash noted. No erythema. No pallor.  Psychiatric: Her behavior is normal. Her mood appears anxious. Her speech is tangential.  Pt does repeat herself frequently  Is generally anxious  Tearful at times           Assessment & Plan:

## 2012-08-22 NOTE — Assessment & Plan Note (Signed)
Pt needs help with most ambulation - with combination of poor balance (frequent falls) , oa pain in spine and joints, and also neuropathy

## 2012-08-22 NOTE — Assessment & Plan Note (Signed)
I feel pt would be safer in a facility with 24 hour care given she will need increasing pain medication to meet needs for comfort  Disc with pt and daughter - opt for placement in facility

## 2012-08-25 ENCOUNTER — Telehealth: Payer: Self-pay

## 2012-08-25 ENCOUNTER — Telehealth: Payer: Self-pay | Admitting: Family Medicine

## 2012-08-25 NOTE — Telephone Encounter (Signed)
Spoke with Daughter and advise her forms are ready for pick-up and are at front desk

## 2012-08-25 NOTE — Telephone Encounter (Signed)
Alona Bene dropped off a FL-2  form for her mother to be filled out.

## 2012-08-25 NOTE — Telephone Encounter (Signed)
Alona Bene, pts daughter left v/m she will come by office today approx 4:20 to pick up form that was left at Dr Royden Purl office to be completed.

## 2012-08-25 NOTE — Telephone Encounter (Signed)
Form in your inbox 

## 2012-08-26 NOTE — Telephone Encounter (Signed)
Done and in IN box 

## 2012-08-26 NOTE — Telephone Encounter (Signed)
Left voicemail letting pt's daughter Alona Bene know form ready for pick-up

## 2012-09-06 ENCOUNTER — Emergency Department: Payer: Self-pay | Admitting: Emergency Medicine

## 2012-09-06 LAB — URINALYSIS, COMPLETE
Blood: NEGATIVE
Glucose,UR: NEGATIVE mg/dL (ref 0–75)
Nitrite: NEGATIVE
Protein: NEGATIVE
Squamous Epithelial: 5

## 2012-09-06 LAB — COMPREHENSIVE METABOLIC PANEL
Albumin: 3.6 g/dL (ref 3.4–5.0)
Calcium, Total: 8.9 mg/dL (ref 8.5–10.1)
Chloride: 93 mmol/L — ABNORMAL LOW (ref 98–107)
Co2: 31 mmol/L (ref 21–32)
Creatinine: 0.8 mg/dL (ref 0.60–1.30)
EGFR (African American): >60
Osmolality: 260 (ref 275–301)
SGOT(AST): 21 U/L (ref 15–37)
Total Protein: 6.9 g/dL (ref 6.4–8.2)

## 2012-09-06 LAB — CBC
MCHC: 33.7 g/dL (ref 32.0–36.0)
MCV: 87 fL (ref 80–100)
RDW: 13.8 % (ref 11.5–14.5)
WBC: 6.2 10*3/uL (ref 3.6–11.0)

## 2012-09-08 LAB — URINE CULTURE

## 2012-09-09 ENCOUNTER — Inpatient Hospital Stay: Payer: Self-pay | Admitting: Internal Medicine

## 2012-09-09 LAB — URINALYSIS, COMPLETE
Blood: NEGATIVE
Leukocyte Esterase: NEGATIVE
Nitrite: NEGATIVE
Ph: 6 (ref 4.5–8.0)
Protein: NEGATIVE
RBC,UR: 3 /HPF (ref 0–5)
Specific Gravity: 1.009 (ref 1.003–1.030)
Squamous Epithelial: 1
WBC UR: <1 /HPF (ref 0–5)

## 2012-09-09 LAB — CBC
HGB: 11.5 g/dL — ABNORMAL LOW (ref 12.0–16.0)
MCH: 29.4 pg (ref 26.0–34.0)
MCHC: 34.4 g/dL (ref 32.0–36.0)
RBC: 3.93 10*6/uL (ref 3.80–5.20)
RDW: 13.2 % (ref 11.5–14.5)
WBC: 6.6 10*3/uL (ref 3.6–11.0)

## 2012-09-09 LAB — COMPREHENSIVE METABOLIC PANEL
Albumin: 3.6 g/dL (ref 3.4–5.0)
Alkaline Phosphatase: 54 U/L (ref 50–136)
Anion Gap: 8 (ref 7–16)
Bilirubin,Total: 0.6 mg/dL (ref 0.2–1.0)
Calcium, Total: 8.6 mg/dL (ref 8.5–10.1)
Co2: 29 mmol/L (ref 21–32)
EGFR (African American): >60
EGFR (Non-African Amer.): 56 — ABNORMAL LOW
Osmolality: 253 (ref 275–301)
SGOT(AST): 22 U/L (ref 15–37)

## 2012-09-09 LAB — CK TOTAL AND CKMB (NOT AT ARMC): CK, Total: 44 U/L (ref 21–215)

## 2012-09-09 LAB — SODIUM, URINE, RANDOM: Sodium, Urine Random: 68 mmol/L (ref 20–110)

## 2012-09-10 LAB — BASIC METABOLIC PANEL
Anion Gap: 6 — ABNORMAL LOW (ref 7–16)
BUN: 17 mg/dL (ref 7–18)
Chloride: 90 mmol/L — ABNORMAL LOW (ref 98–107)
Co2: 29 mmol/L (ref 21–32)
Creatinine: 0.74 mg/dL (ref 0.60–1.30)
EGFR (African American): >60
EGFR (Non-African Amer.): >60
Potassium: 3.1 mmol/L — ABNORMAL LOW (ref 3.5–5.1)
Sodium: 125 mmol/L — ABNORMAL LOW (ref 136–145)

## 2012-09-10 LAB — CBC WITH DIFFERENTIAL/PLATELET
Basophil #: 0.1 10*3/uL (ref 0.0–0.1)
Basophil %: 0.8 %
HCT: 32.3 % — ABNORMAL LOW (ref 35.0–47.0)
Lymphocyte #: 1.3 10*3/uL (ref 1.0–3.6)
MCHC: 34.1 g/dL (ref 32.0–36.0)
MCV: 86 fL (ref 80–100)
Monocyte #: 0.8 x10 3/mm (ref 0.2–0.9)
Monocyte %: 10.1 %
Neutrophil %: 70.8 %
RDW: 13 % (ref 11.5–14.5)
WBC: 7.8 10*3/uL (ref 3.6–11.0)

## 2012-09-11 LAB — BASIC METABOLIC PANEL
Anion Gap: 7 (ref 7–16)
BUN: 13 mg/dL (ref 7–18)
Co2: 25 mmol/L (ref 21–32)
Creatinine: 0.65 mg/dL (ref 0.60–1.30)
EGFR (African American): >60
Glucose: 102 mg/dL — ABNORMAL HIGH (ref 65–99)
Sodium: 128 mmol/L — ABNORMAL LOW (ref 136–145)

## 2012-09-12 LAB — URINE CULTURE

## 2012-09-13 LAB — CBC WITH DIFFERENTIAL/PLATELET
Basophil #: 0.1 10*3/uL (ref 0.0–0.1)
Basophil %: 0.5 %
Eosinophil %: 0.9 %
HGB: 9.9 g/dL — ABNORMAL LOW (ref 12.0–16.0)
Lymphocyte #: 1.4 10*3/uL (ref 1.0–3.6)
Lymphocyte %: 14.6 %
Monocyte #: 1 x10 3/mm — ABNORMAL HIGH (ref 0.2–0.9)
Neutrophil #: 7.1 10*3/uL — ABNORMAL HIGH (ref 1.4–6.5)
RBC: 3.35 10*6/uL — ABNORMAL LOW (ref 3.80–5.20)
RDW: 13.3 % (ref 11.5–14.5)
WBC: 9.6 10*3/uL (ref 3.6–11.0)

## 2012-09-13 LAB — BASIC METABOLIC PANEL
BUN: 16 mg/dL (ref 7–18)
Chloride: 96 mmol/L — ABNORMAL LOW (ref 98–107)
Creatinine: 0.82 mg/dL (ref 0.60–1.30)
EGFR (African American): >60
EGFR (Non-African Amer.): >60
Osmolality: 258 (ref 275–301)
Sodium: 128 mmol/L — ABNORMAL LOW (ref 136–145)

## 2012-09-13 LAB — SEDIMENTATION RATE: Erythrocyte Sed Rate: 65 mm/hr — ABNORMAL HIGH (ref 0–30)

## 2012-09-14 ENCOUNTER — Telehealth: Payer: Self-pay

## 2012-09-14 NOTE — Telephone Encounter (Signed)
Thank you :)

## 2012-09-14 NOTE — Telephone Encounter (Signed)
Verbal orders given to Amil Amen and request for records sent

## 2012-09-14 NOTE — Telephone Encounter (Signed)
[facility] faxed and advise that pt is still in hospital and no d/c available but they will fax it once it's available

## 2012-09-14 NOTE — Telephone Encounter (Signed)
Amil Amen with [facility] left v/m that pt discharging from [facility] on 09/15/12 and request verbal orders for Home Health PT,OT,nursing and social work to help with disease education, care issues and strengthen pt.Please advise.

## 2012-09-14 NOTE — Telephone Encounter (Signed)
Please verbally ok those orders and send for d//c summary-thanks

## 2012-09-18 LAB — CULTURE, BLOOD (SINGLE)

## 2012-09-19 ENCOUNTER — Emergency Department: Payer: Self-pay | Admitting: Emergency Medicine

## 2012-10-30 DEATH — deceased

## 2013-05-24 IMAGING — US US EXTREM LOW VENOUS BILAT
1 series · 14 of 24 positions shown · non-contrast
Comparison: none

REASON FOR EXAM: fever, pt s/p fall.
COMMENTS:

PROCEDURE:     US  - US DOPPLER LOW EXTR BILATERAL  - September 14, 2012 [DATE]
RESULT:     Technique: Grayscale, duplex, color flow and SPECTRAL waveform
imaging was performed to interrogate the deep venous structures of the right
and left lower extremities.

[Series 1: us extrem low venous bilat · 0.11mm/px · 14 of 50 slices shown]
[im 1/50]
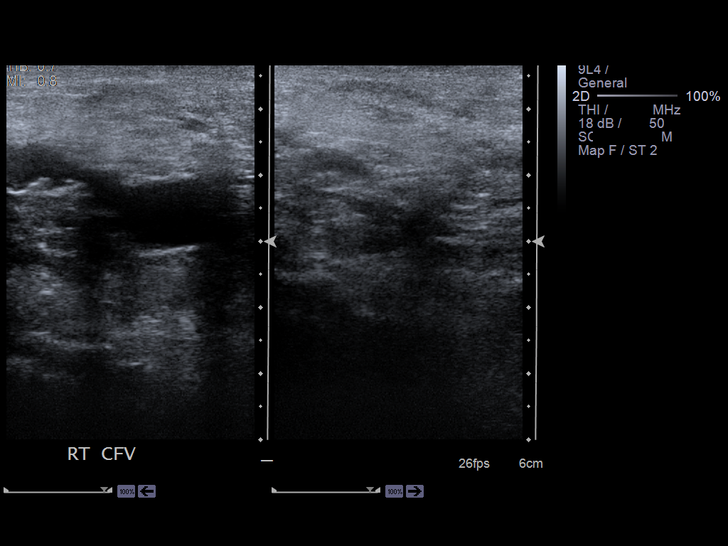
[im 5/50]
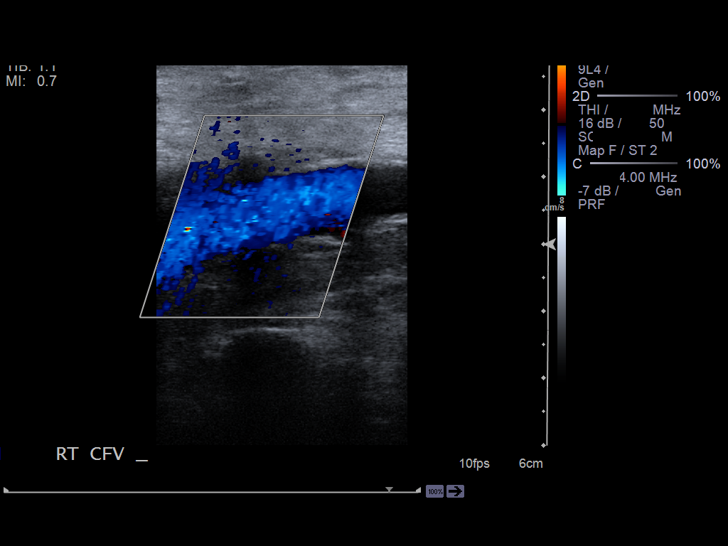
[im 9/50]
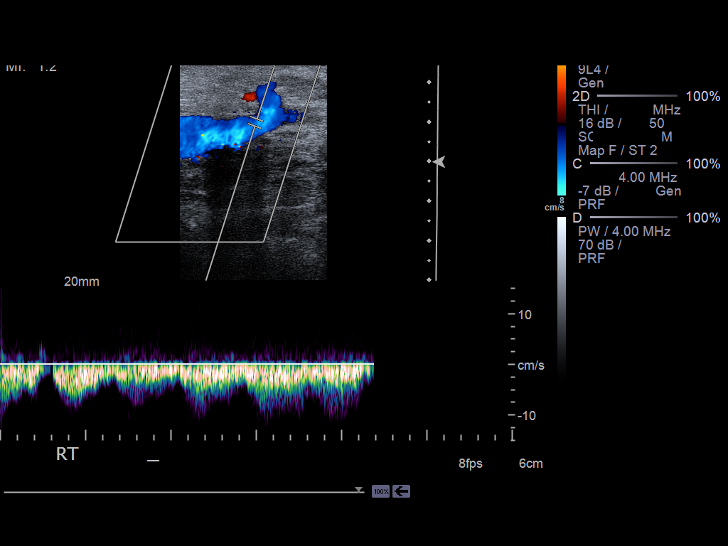
[im 13/50]
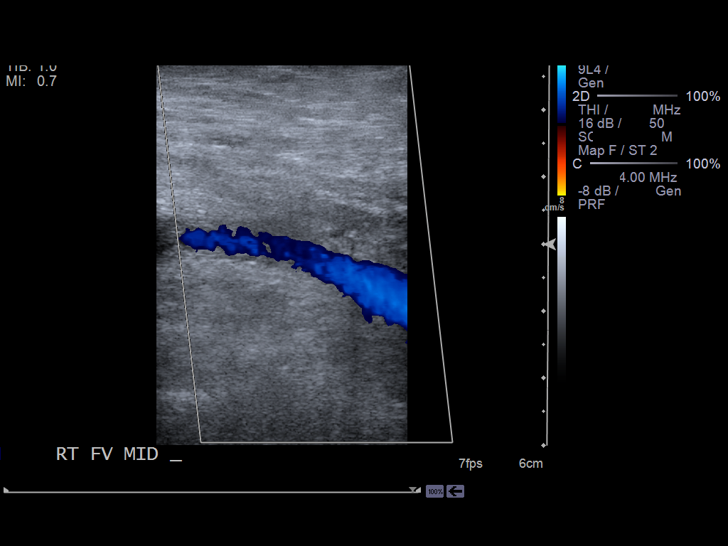
[im 15/50]
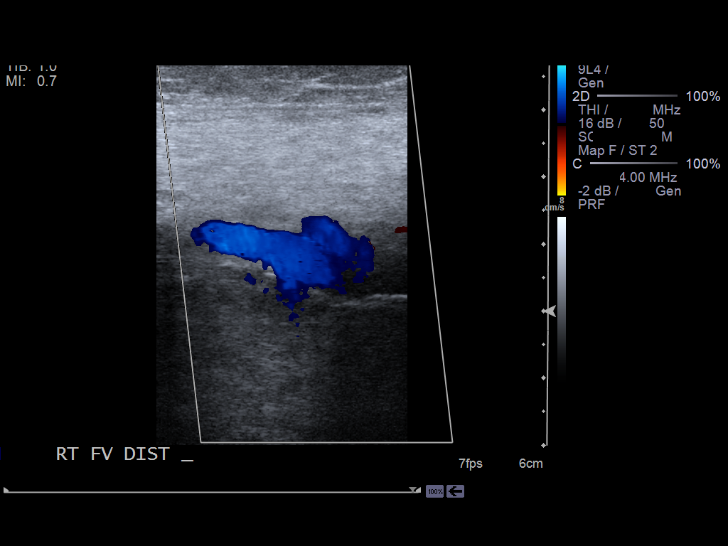
[im 20/50]
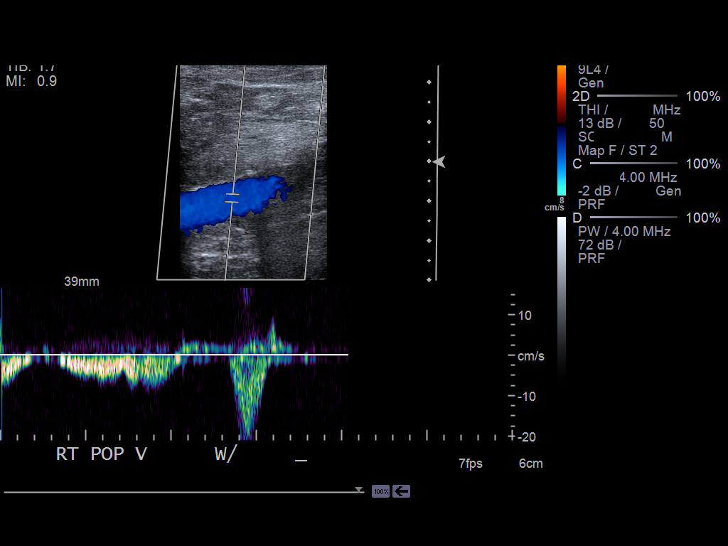
[im 24/50]
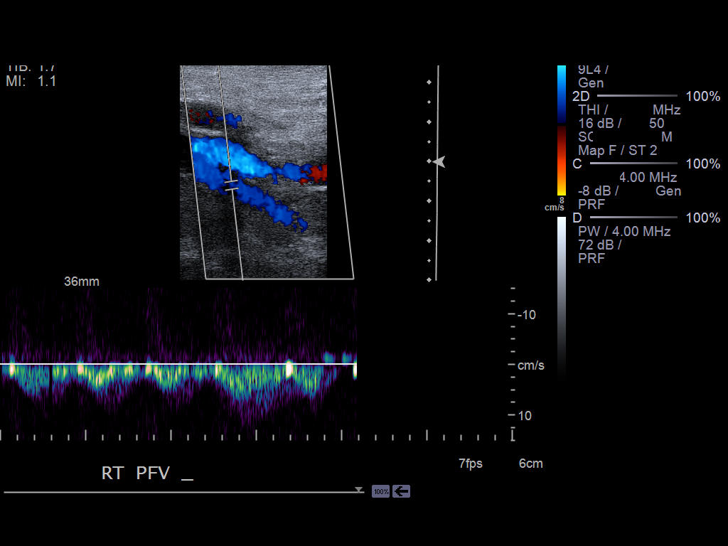
[im 26/50]
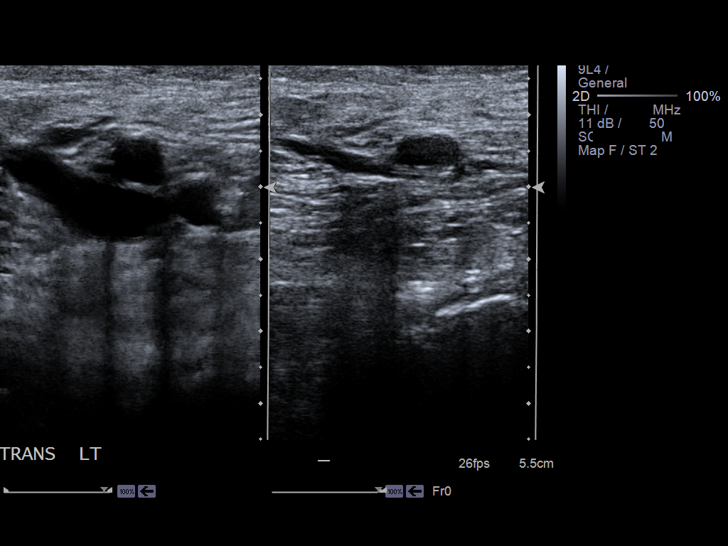
[im 30/50]
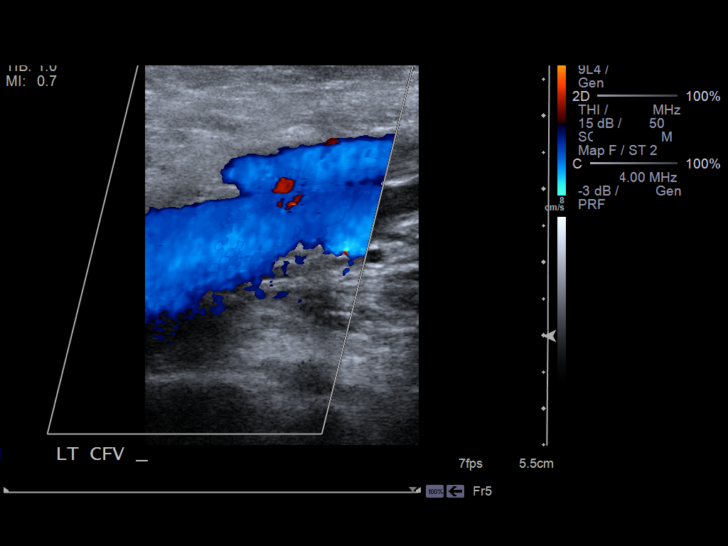
[im 35/50]
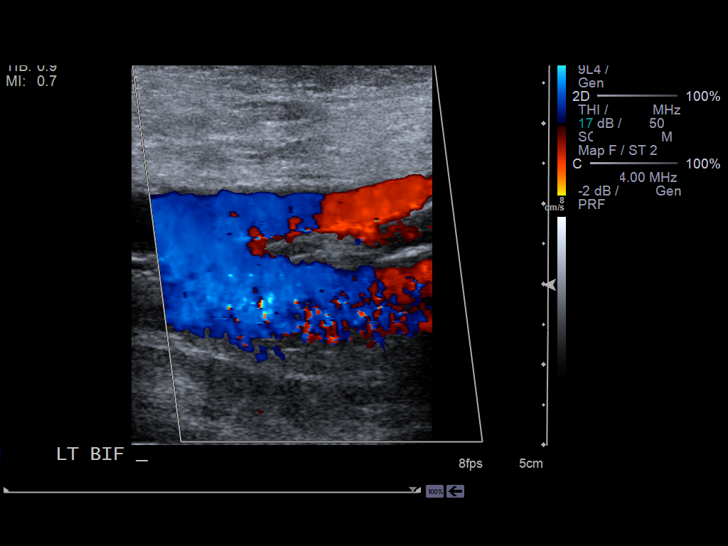
[im 39/50]
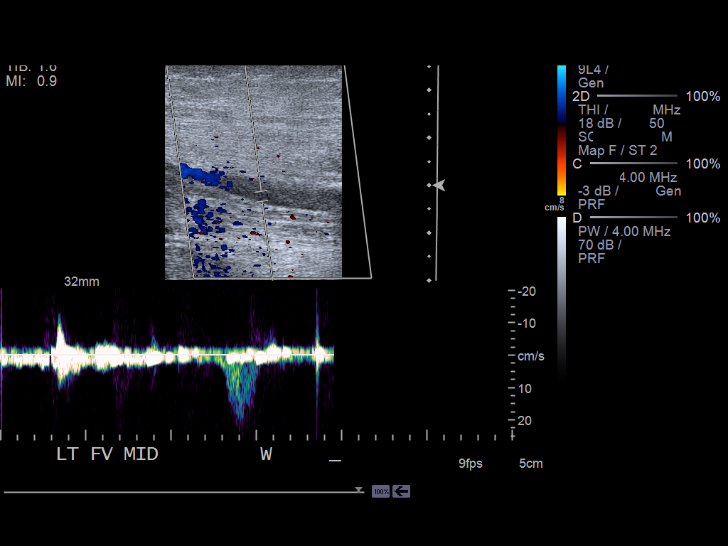
[im 41/50]
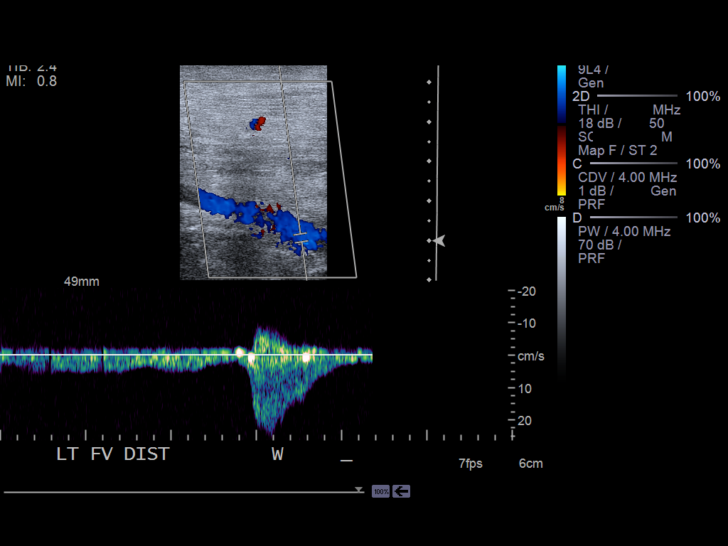
[im 45/50]
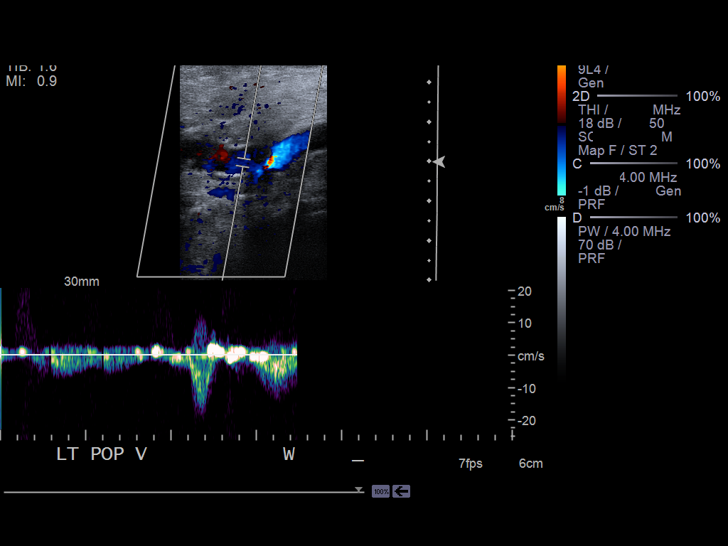
[im 50/50]
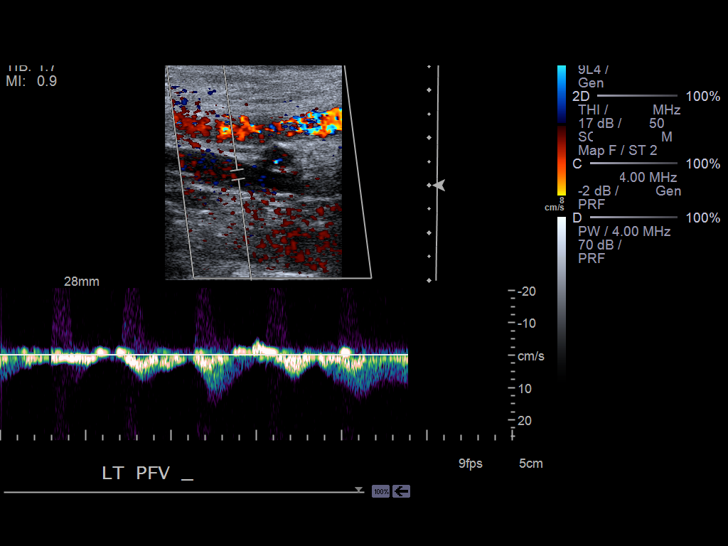

[14 of 24 positions shown; findings below may reference images not displayed]

FINDINGS: There is no evidence of increased echogenicity, non-compressibility,
abnormal waveforms nor abnormal color flow within the interrogated deep
venous structures of the right or left lower extremity. There is an
appropriate response to Valsalva and augmentation.
IMPRESSION: 1.  No sonographic evidence of a deep venous thrombus within the right or
left lower extremity.

## 2013-05-26 IMAGING — CT CT ABD-PELV W/O CM
1 of 2 series · 14 of 32 positions shown, 18 images · non-contrast
Comparison: none

REASON FOR EXAM: (1) abdominal pain; (2) abdominal pain/nausea
COMMENTS:

[Series 2: 3mm soft tissue · axial · 0.68mm/px · z∈[-946,-596]mm · 14 of 134 slices shown, 18 images]
[im 11/134  soft-tissue]
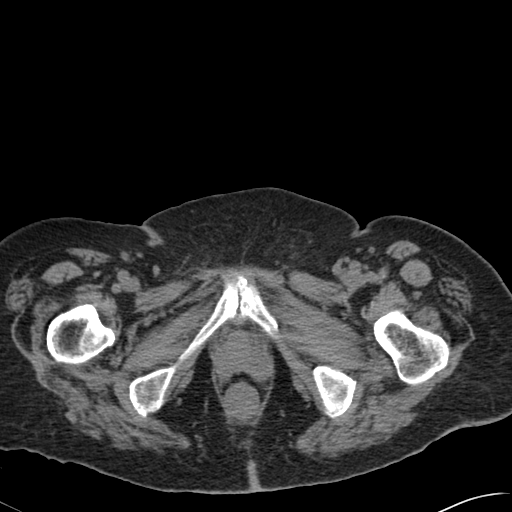
[im 11/134  bone]
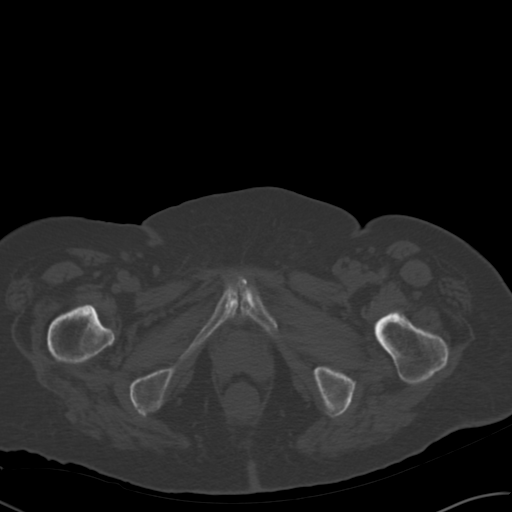
[im 22/134  soft-tissue]
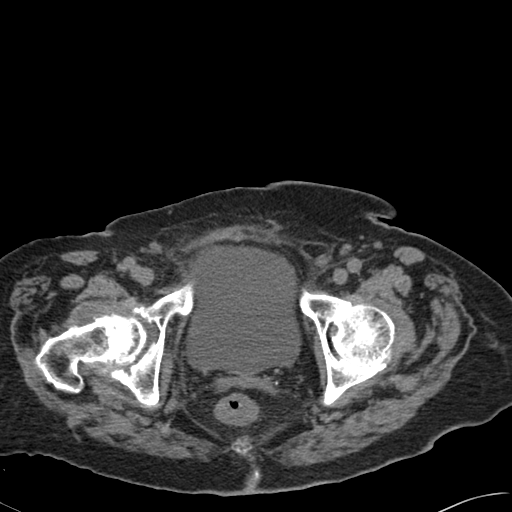
[im 32/134  soft-tissue]
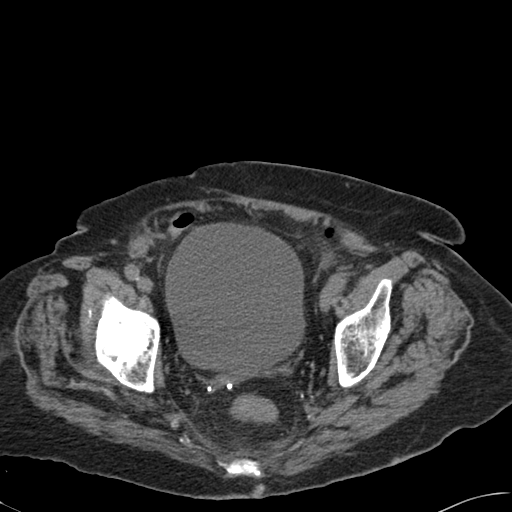
[im 43/134  soft-tissue]
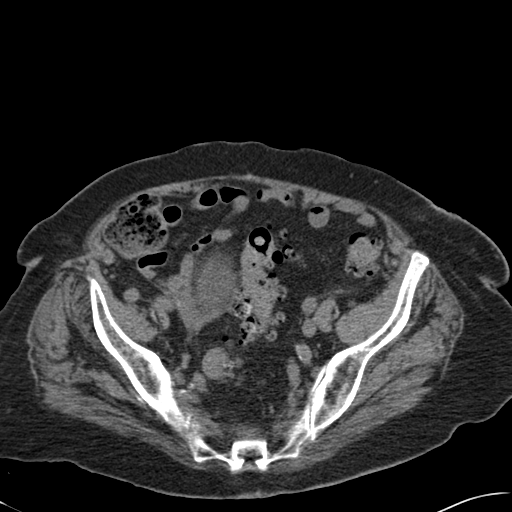
[im 54/134  soft-tissue]
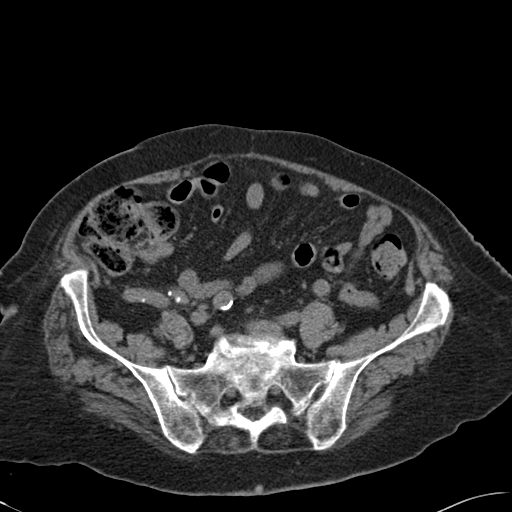
[im 64/134  soft-tissue]
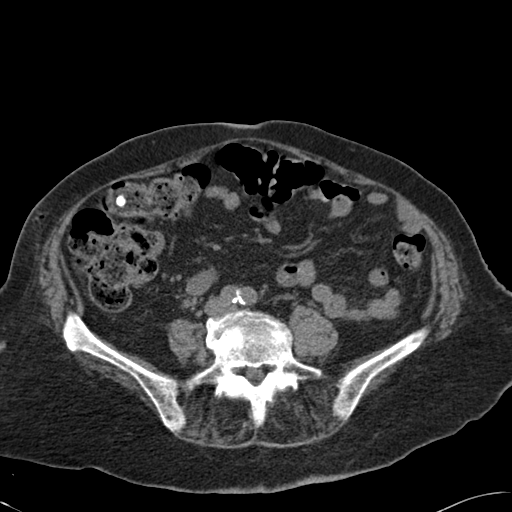
[im 75/134  soft-tissue]
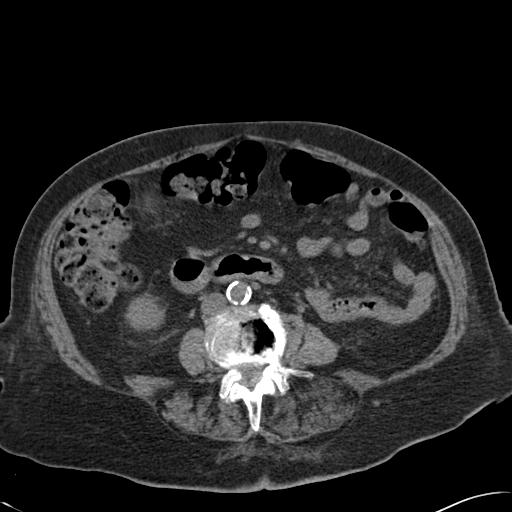
[im 86/134  soft-tissue]
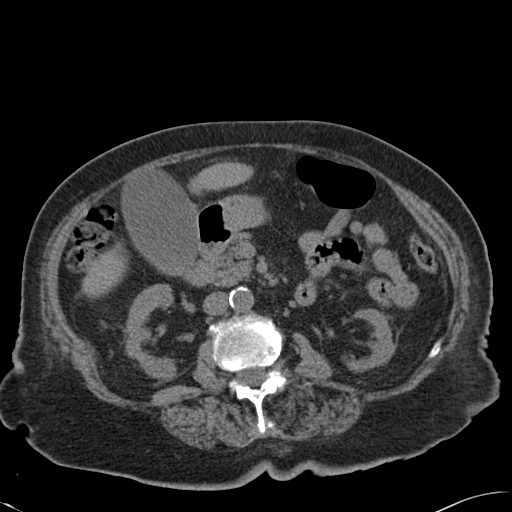
[im 96/134  soft-tissue]
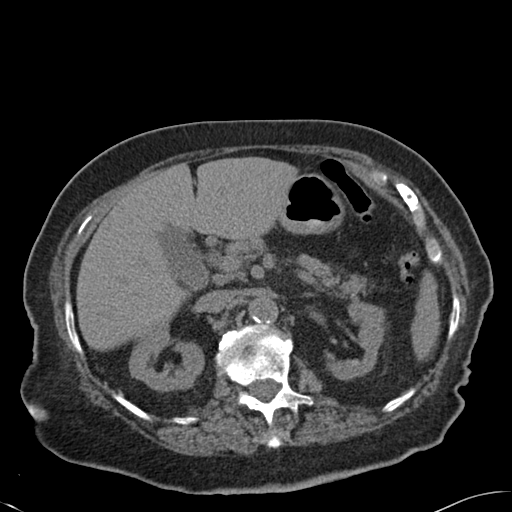
[im 96/134  bone]
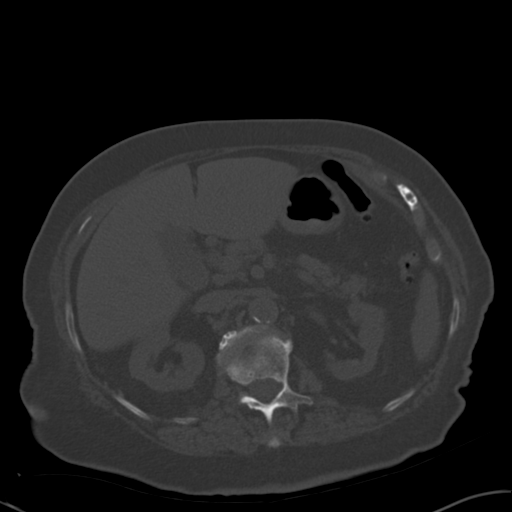
[im 107/134  soft-tissue]
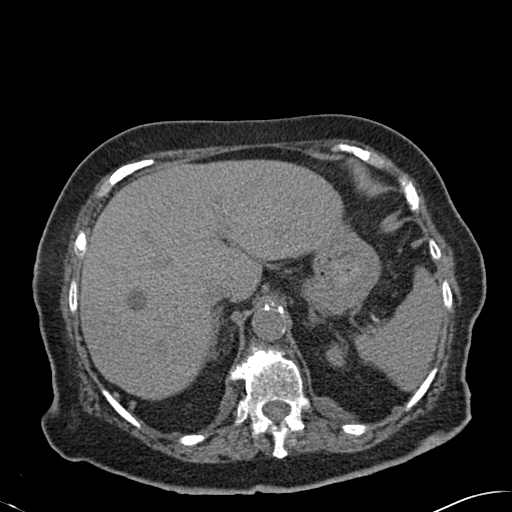
[im 112/134  lung]
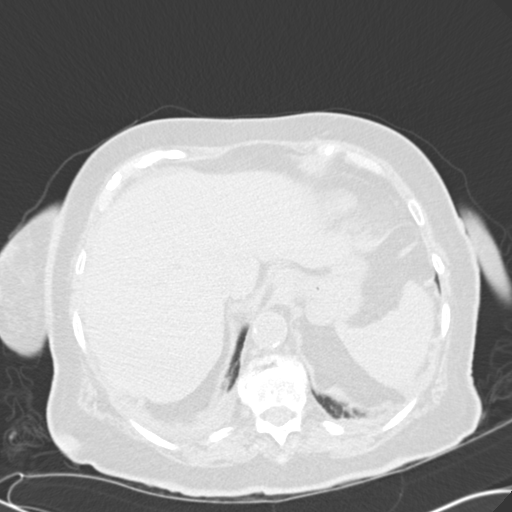
[im 118/134  soft-tissue]
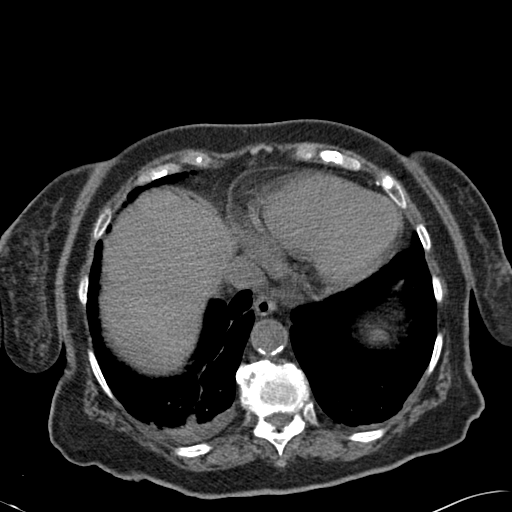
[im 118/134  lung]
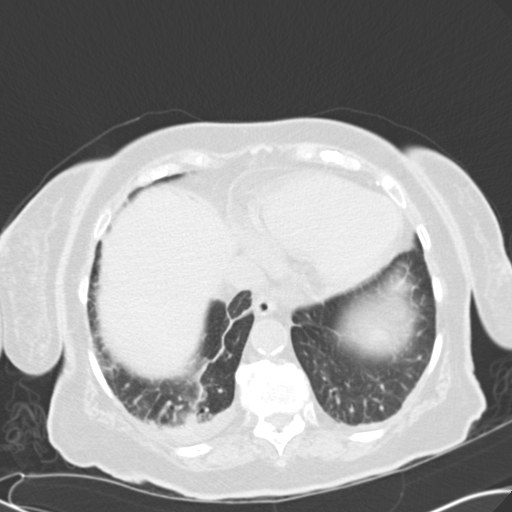
[im 123/134  lung]
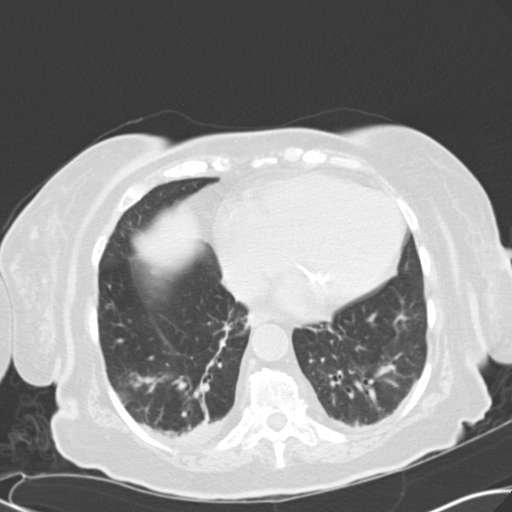
[im 128/134  soft-tissue]
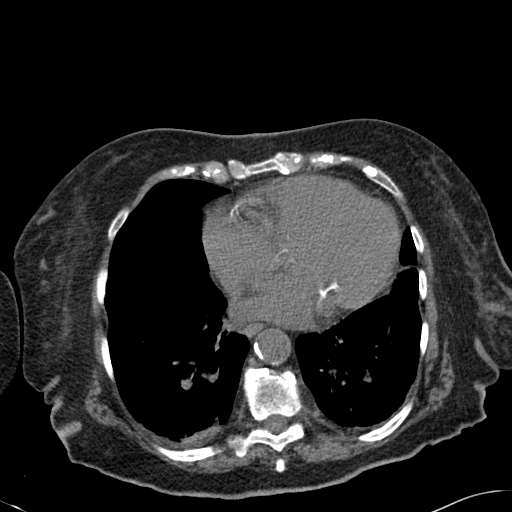
[im 128/134  lung]
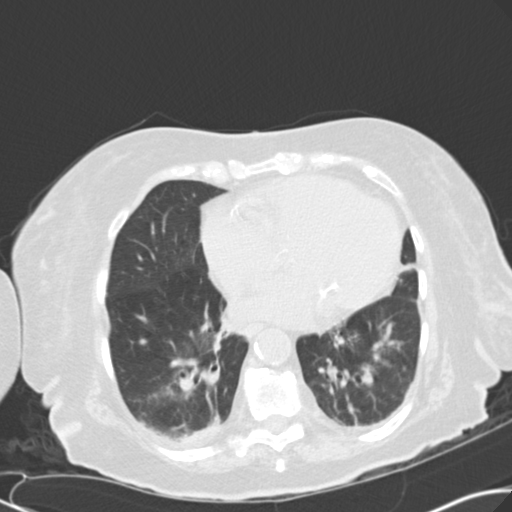

[14 of 32 positions shown; findings below may reference images not displayed]

PROCEDURE:     CT  - CT ABDOMEN AND PELVIS W[DATE]  [DATE]

RESULT:     Noncontrast CT of the abdomen and pelvis is performed. Images
are reconstructed in the axial plane at 3 mm slice thickness after a
multislice helical acquisition. Multiplanar reconstructions are performed
with [employer] software at the time of interpretation. The patient has no
previous study for comparison.

There multiple low-attenuation areas within the left and right lobes of the
liver which are nonspecific but could represent cysts. Hemangiomas or not
excluded. There is trace pleural effusion on the right and pleural
thickening versus minimal pleural effusion on the left. There is some
respiratory motion artifact in the lung bases with some linear areas of
fibrosis in both lower lobes. Some minimal atelectasis in the right lower
lobe is not excluded. The abdominal aorta is normal in caliber without
evidence of aneurysm. Atherosclerotic calcification is present. There is a
small amount of air in the urinary bladder nondependent region anteriorly.
No Foley catheter is seen. Correlate for recent instrumentation or
catheterization. Colonic diverticulosis is present especially in the sigmoid
region and ascending colon without CT evidence of acute diverticulitis.
There is no ascites or abnormal fluid collection. The urinary bladder is
moderately distended. Hysterectomy changes are present. There is a small fat
filled inguinal hernia on the left. The appendix is not identified. The
spleen, kidneys, adrenal glands and pancreas appear unremarkable. No
adenopathy is evident by size criteria. Bony structures demonstrate
degenerative change diffusely in the spine with disc space narrowing and
degenerative endplate changes as well as degenerative disc disease. Severe
degenerative changes are seen in the right hip and moderately severe
degenerative changes in the left hip.
IMPRESSION: 1. Small amount of air in the urinary bladder, presumably secondary to
instrumentation or catheterization.
2. Trace right pleural effusion possibly with minimal pleural thickening or
pleural fluid in the left hemithorax with bilateral lung base areas of
atelectasis or fibrosis. Multiple well circumscribed low-attenuation areas
are seen in the liver consistent with cysts.
3. Scattered colonic diverticular disease. No evidence of acute inflammatory
process.
5. Small fat filled left inguinal hernia.

[REDACTED]

## 2013-05-29 IMAGING — CT CT HEAD WITHOUT CONTRAST
1 series · 16 of 30 positions shown, 20 images · non-contrast
Comparison: none

REASON FOR EXAM: fall may have hit head-not sure
COMMENTS:

[Series 2: soft tissue · axial · 0.43mm/px · z∈[-224,-84]mm · 16 of 32 slices shown, 20 images]
[im 2/32  brain]
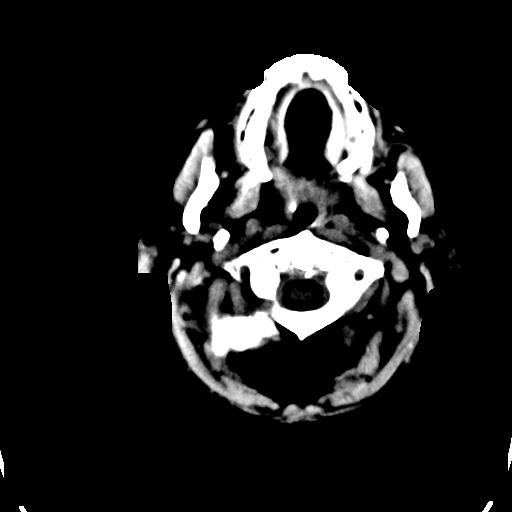
[im 2/32  bone]
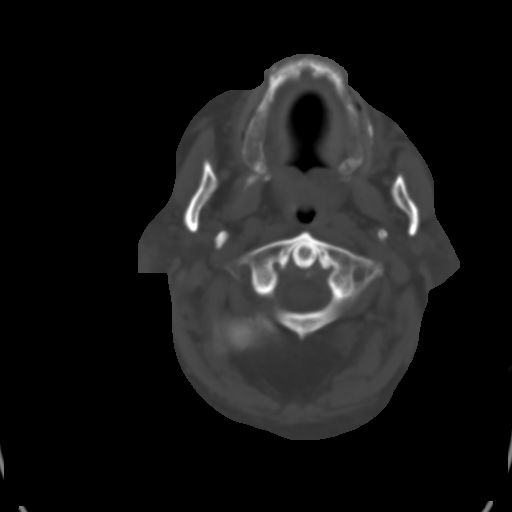
[im 4/32  brain]
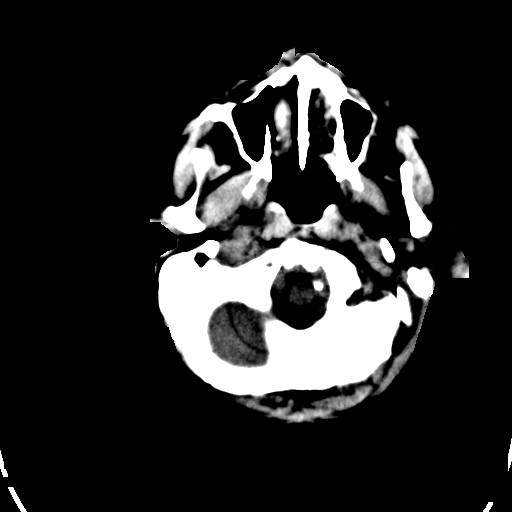
[im 6/32  brain]
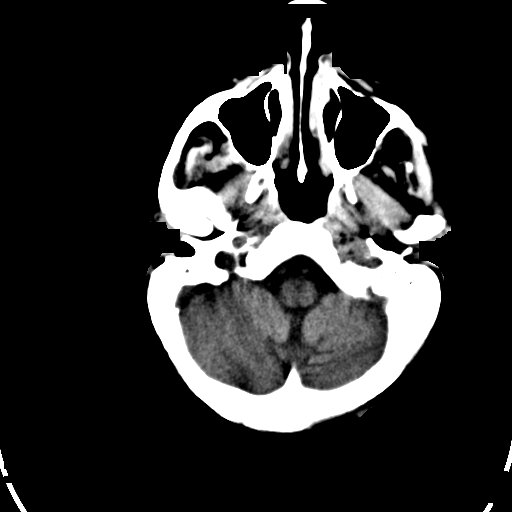
[im 8/32  brain]
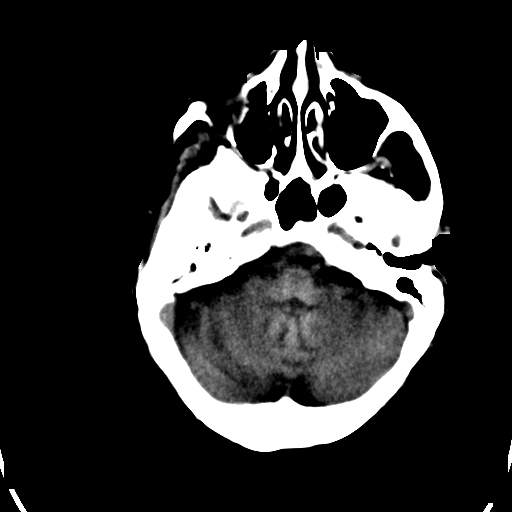
[im 9/32  brain]
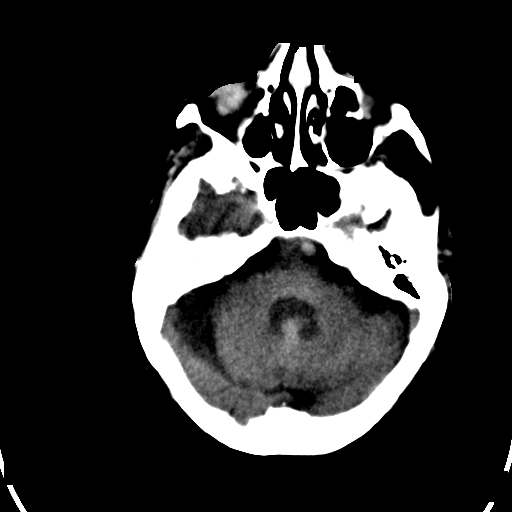
[im 9/32  bone]
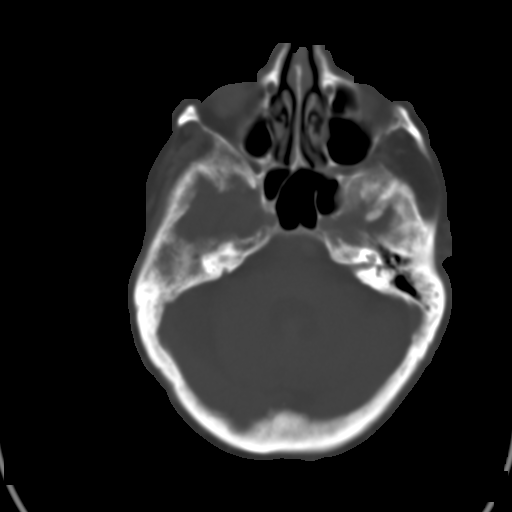
[im 11/32  brain]
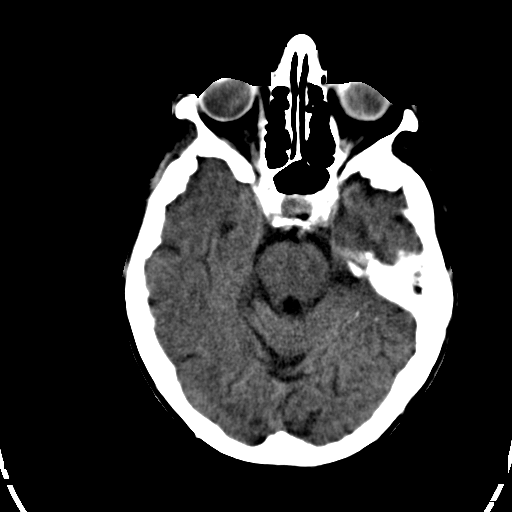
[im 13/32  brain]
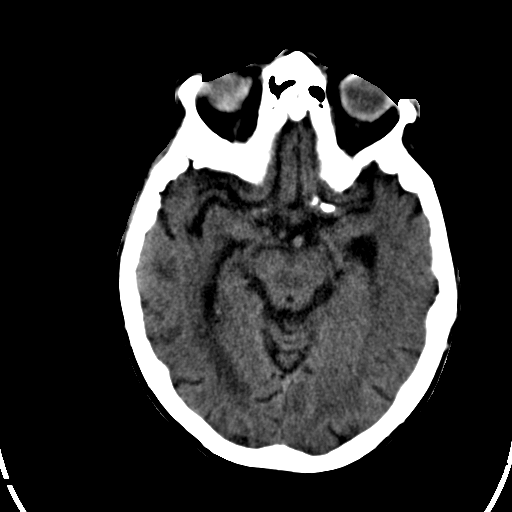
[im 15/32  brain]
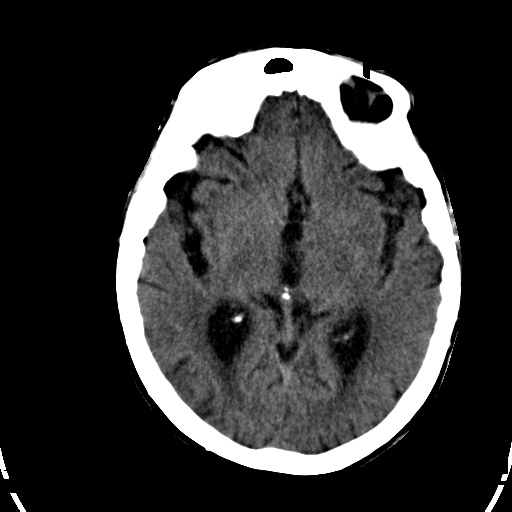
[im 17/32  brain]
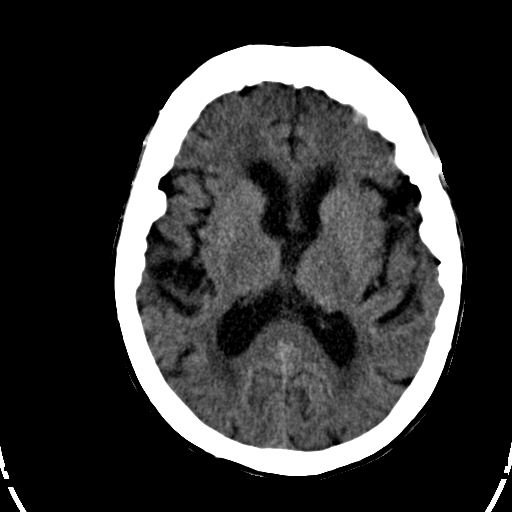
[im 17/32  bone]
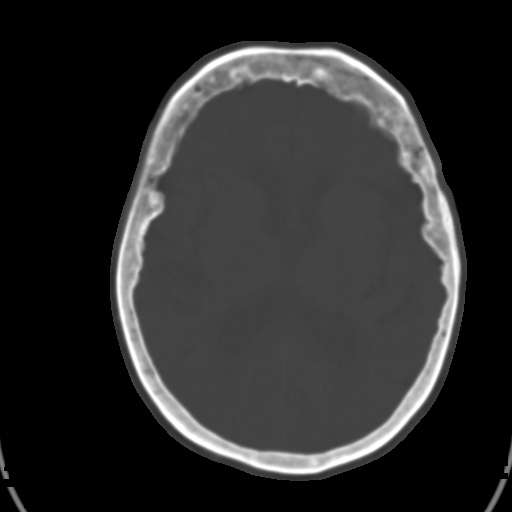
[im 19/32  brain]
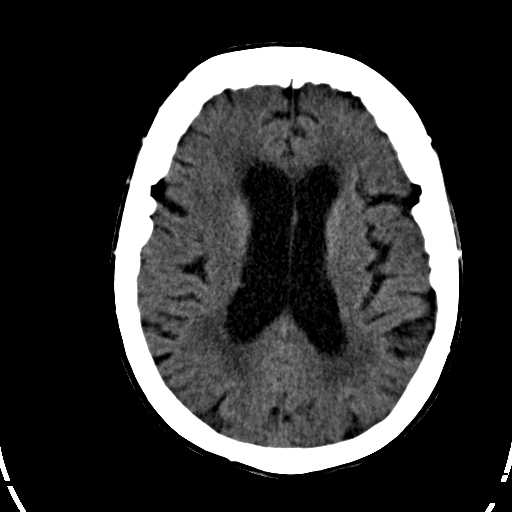
[im 21/32  brain]
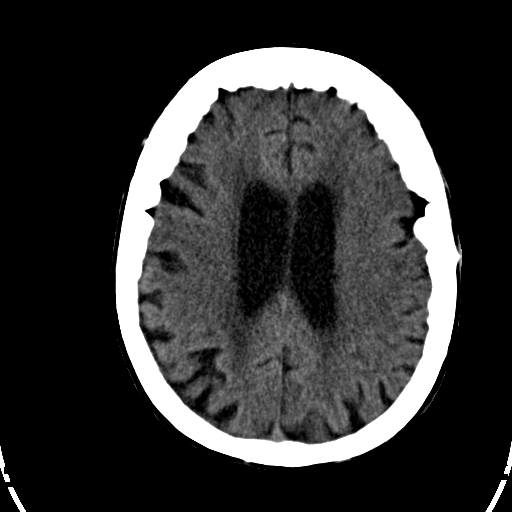
[im 23/32  brain]
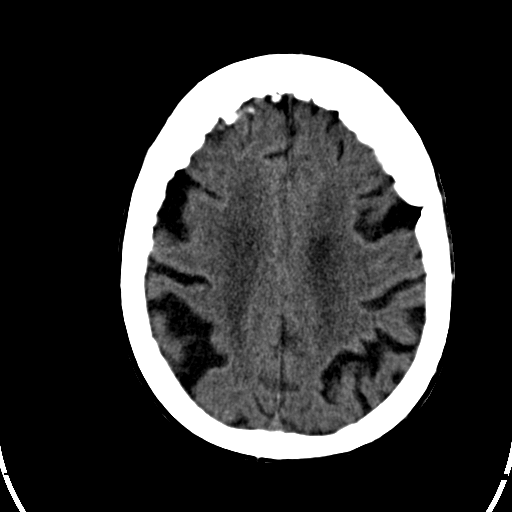
[im 24/32  brain]
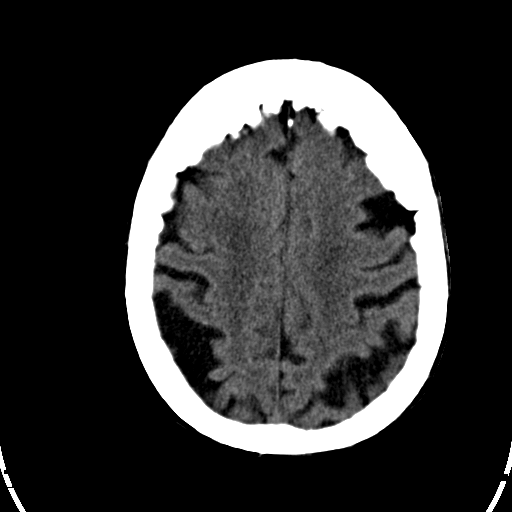
[im 24/32  bone]
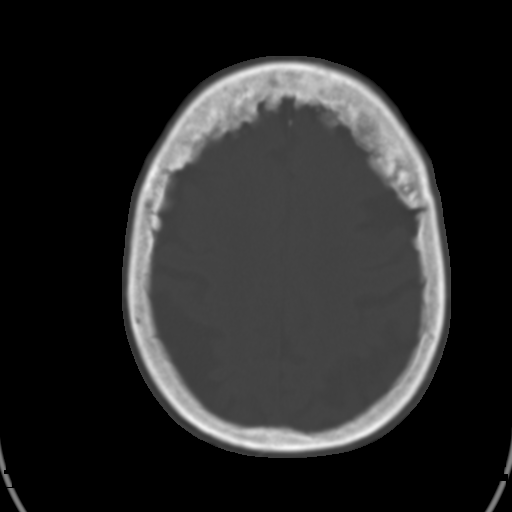
[im 26/32  brain]
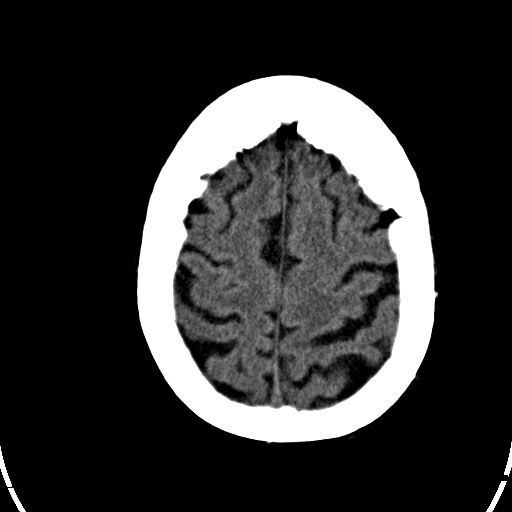
[im 28/32  brain]
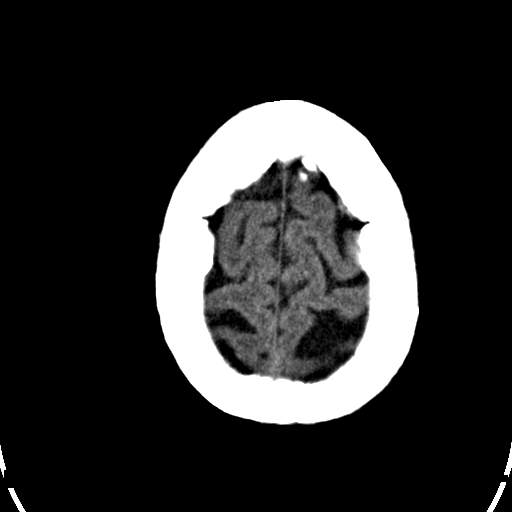
[im 30/32  brain]
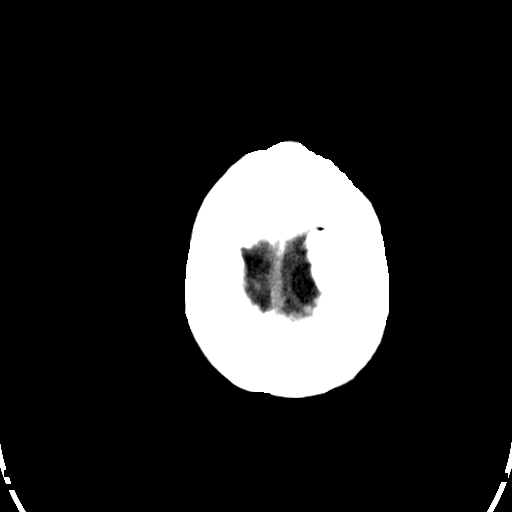

[16 of 30 positions shown; findings below may reference images not displayed]

PROCEDURE:     CT  - CT HEAD WITHOUT CONTRAST  - September 19, 2012  [DATE]

RESULT:     Emergent noncontrast CT of the brain is compared to the previous
study 09 September, 2012.

There is prominence of the ventricles and sulci. Low-attenuation is seen
diffusely within the periventricular and subcortical white matter. There is
no intracranial hemorrhage, mass or mass effect. No extra-axial fluid
collection is seen. There is normal aeration of the paranasal sinuses. There
is no calvarial destruction or depressed bone fracture. There is
opacification of the right mastoid air cells which is unchanged. There is
opacification and sclerosis in the left mastoid tip.
IMPRESSION: 1. Changes of atrophy with chronic microvascular ischemic disease. No acute
intracranial abnormality.
2. Stable opacification of the right mastoid air cells.

[REDACTED]

## 2014-09-21 NOTE — Discharge Summary (Signed)
PATIENT NAME:  Carolyn Farley, Carolyn Farley MR#:  [id number] DATE OF BIRTH:  1916-07-03  DATE OF ADMISSION:  09/09/2012 DATE OF DISCHARGE:  04/20/201404/20/2014  DISPOSITION:  [facility].    CODE STATUS:  DO NOT RESUSCITATE.   CONSULTATIONS:  Orthopedic consult with Dr. Mack Guise, ID consult Dr. Clayborn Bigness.   DISCHARGE DIAGNOSES: 1.  Right ankle sprain due to fall.  2.  Hypertension.  3.  Hyperlipidemia.  4.  History of Bell's palsy.  5.  Macular degeneration.  6.  Dementia.  7.  History of neuropathy.   DISCHARGE MEDICATIONS: 1.  Vitamin B12 1200 mg daily.  2.  Vitamin D3 2000 international units daily.  3.  Multivitamin 1 tablet daily.  4.  Flonase 50 mcg 2 sprays in each nostril daily.  5.  Fish oil 1 gram p.o. daily.  6.  Norco 5/325 every 6 hours as needed for pain.   7.  Neurontin 600 mg at bedtime and also 300 mg during the daytime.  8.  Nitroglycerin sublingual as needed for chest pain.  9.  Hydralazine is a new medicine for her blood pressure and she is on hydralazine 25 mg p.o. q.i.d.  10.  Colace 100 mg p.o. b.i.d.   DIET:  Nectar-thick liquids with pureed diet, aspiration precautions. Medicine pureed.  Needs assistance with feeding.   Original interim discharge dictation done by Dr. Darvin Neighbours.  Refer to that.   BRIEF HOSPITALIZATION HISTORY:  The patient is a 81 year old female with history of multiple medical problems of hypertension, hyperlipidemia, Bell's palsy, dementia, hemodynamic a fall. The patient was at [facility]. The patient was walking with a walker and fell down and suffered right hip pain, came to the Emergency Room pain. For the right hip pain, the patient had x-rays and MRIs which did not show any fracture. The patient complained of right-sided shoulder pain, chest pain, abdominal pain, hip pain and ankle pain. The patient was seen by Dr. Mack Guise who suggested physical therapy, no acute intervention. The patient started on OxyContin ER, however, daughter said the patient became  more lethargic on OxyContin and the patient did not eat much during the hospital stay so the patient's daughter wanted only Norco but not any other pain killers. The patient is on morphine also, 2 mg IV q.6 during the hospital stay, but that also is stopped. Hopefully, the Norco itself will control the pain and see how she does. The patient also was given heparin for DVT prophylaxis but the daughter said heparin makes her sick and because of that she is not eating and also told that the patient's family has a history of intolerance to heparin and they stop eating. It apparently makes them very nauseous and decreased intake so the patient's daughter wanted her to be off the heparin so we stopped the heparin as well.    The patient had a UTI and the patient received Rocephin from 11th through 17th and seen by Dr. Clayborn Bigness as well because of persistent fevers. The patient had lower extremity ultrasound which did not show any DVT.  Her blood cultures did not show any growth. She also had a CT of the abdomen because she complained of pain on the right side of abdomen, which did not show any acute changes. It does show some pleural effusions on the right side which are trace and she also has scattered colonic diverticula. I believe her right-sided pain is because of the fall and the patient is signed out by Dr. Clayborn Bigness. She already finished  a course of Rocephin. She was also seen by Dr. Burt Knack from surgery and did not think any surgical intervention for her. The patient  right now is afebrile for the past 2 days.   Hyponatremia which is chronic reviewing the old records, improved with IV fluids and the patient's recent sodium is 128 on April 15th.   The patient's condition is stable. CODE STATUS: DO NOT RESUSCITATE. The patient was getting Zoloft and heparin before but her daughter said the patient only takes medicine which is HCTZ, statins and Neurontin but does not take any other medications that are listed in home  medication list so we tried to cut down on the other medications and see if that will help her with her p.o. intake. The patient's condition is stable.   TIME SPENT ON DISCHARGE PREPARATION: Took more than 30 minutes.    ____________________________ Epifanio Lesches, MD sk:cs D: 09/18/2012 11:53:00 ET T: 09/18/2012 14:57:50 ET JOB#: [id number]  cc: Epifanio Lesches, MD, <Dictator> Epifanio Lesches MD ELECTRONICALLY SIGNED 09/29/2012 22:30

## 2014-09-21 NOTE — Consult Note (Signed)
PATIENT NAME:  Carolyn Farley, Carolyn Farley MR#:  [id number] DATE OF BIRTH:  [birth year]  DATE OF CONSULTATION:  09/13/2012  REFERRING PHYSICIAN:  Srikar R. [place], MD CONSULTING PHYSICIAN:  Heinz Knuckles. Markisha Meding, MD  REASON FOR CONSULT:  Fever.   HISTORY OF PRESENT ILLNESS: The patient is a 81 year old female with a past history significant for osteoarthritis who was admitted after several falls at home at her assisted living facility. She was initially seen in the Emergency Room after a fall on 04/08.  She had significant pain on the right. She was given pain medications after x-ray showed no evidence for fracture. She fell again on 04/11, which prompted her admission. She has had multiple imaging studies of her joints on the right and has had no obvious evidence for fracture. Orthopedics has seen her and did not feel that there is any surgical indication. She has been having fevers during her hospitalization and has been started on ceftriaxone. Chest x-rays have not shown any infiltrate, and urinalysis and urine cultures have been unrevealing. She did not have any blood cultures until today, and these are now pending. The patient is a poor historian, and other than complaining of pain she is not able to provide any significant history. Her pain is mainly on the right side.   ALLERGIES: LIPITOR.   PAST MEDICAL HISTORY: 1. Osteoarthritis.  2. Hypertension.  3. Hypercholesterolemia.  4. Bell's palsy.  5. Macular degeneration.  6. Seasonal allergies.  7. Chronic sinus congestion.  8. Status post hysterectomy.  9. Benign tumor removed from the pelvis.   SOCIAL HISTORY: The patient lives in assisted living.  She does not smoke. She does not drink.   FAMILY HISTORY: Positive for multiple cancers. She has a daughter who died at age 52 of breast cancer.   REVIEW OF SYSTEMS: Unable to obtain from the patient due to her confusion. She is only able to say that she has pain.   PHYSICAL EXAMINATION: VITAL SIGNS:  T-max of 101.9, T-current of 97.4, pulse 74, blood pressure 132/71, 92% on room air.  GENERAL: A 81 year old female in no acute distress.  HEENT: Normocephalic, atraumatic. Pupils are equal and reactive to light. Extraocular motion intact. Sclerae, conjunctivae and lids are without evidence for emboli or petechiae. Oropharynx shows normal gums and lips.  NECK: Midline trachea. No lymphadenopathy. No thyromegaly.  CHEST: Clear to auscultation bilaterally with good air movement. No focal consolidation.  CARDIAC: Regular rate and rhythm without murmur, rub or gallop.  ABDOMEN: She was tender on the right side with rebound and guarding. Her abdomen was soft, however it was difficult to determine whether the tenderness on the right was mainly due to palpation of the skin or to deeper palpation. She had no bruising on the right side that was visible. She did have some rebound tenderness on the left that was also referred to the right.  EXTREMITIES: No evidence for tenosynovitis. No swelling.  SKIN: No rashes. No stigmata of endocarditis, specifically, no Janeway lesions or Osler nodes.  NEUROLOGIC: The patient was moving all 4 extremities. She was confused and unable to provide any history other than saying that she had pain and that she was uncomfortable with the way the covers were on the bed.  PSYCHIATRIC: She appeared to be uncomfortable at times, especially during the exam, but otherwise was fairly pleasant.   LABORATORY AND RADIOLOGICAL DATA:  BUN of 16, creatinine 0.82, bicarbonate 24, anion gap of 8, AST 22, ALT 15, alk phos 54, total bilirubin  0.6, total protein 7.0, albumin 3.6. White count of 9.6, hemoglobin of 9.9, platelet count of 262, ANC of 7.1. White count on admission was 6.6. Sed rate today was 65. A urinalysis on admission was unremarkable, with a urine culture suggestive of contamination. A urinalysis from 04/08 had 3+ leukocyte esterase, negative nitrites, and urine culture grew mixed  bacterial flora consistent with contamination.   A hip x-ray on the right showed no fracture. A pelvic x-ray showed moderate-to-severe   degenerative changes of the right hip. A CT scan of the head without contrast showed no acute hemorrhage. A CT scan of the cervical spine without contrast showed no fracture or dislocation. There were moderate degenerative disk changes at multiple levels. Chest x-ray from admission showed no infiltrates. An MRI of the right hip without contrast showed no obvious fracture. There was some edema noted in some of the muscles but no obvious drainable focus. Lumbar spine x-rays showed degenerative disk disease but no fractures. Cervical spine films showed no definite fractures but multilevel degenerative disk disease. Chest x-ray from today showed mild interstitial edema.   IMPRESSION: A 81 year old female with a history of osteoarthritis, admitted with multiple falls, who has fever.   RECOMMENDATIONS: 1. She has had a fever over the past few days. It is difficult to get any history from her about whether she had been having fever prior to admission. Her white count is normal, however, and her UA and chest x-ray did not show signs of infection.  2. She has significant right-sided abdominal pain with rebound and some guarding. There is no bruising along the skin. There is a question whether she could have diverticulitis versus trauma from her fall. She had fallen on her right side.  3. She had blood cultures drawn for the first time today.  4. We will continue the ceftriaxone until the blood cultures return.  5. If her abdominal pain continues, we will consider a CT scan of the abdomen and pelvis. This would be best with contrast.  6. We will get venous Dopplers tomorrow to rule out DVT.   This is a moderately complex infectious disease case.   Thank you very much for involving me in this patient's care.   ____________________________ Heinz Knuckles. Robinette Esters,  MD meb:cb D: 09/13/2012 17:09:39 ET T: 09/13/2012 17:44:36 ET JOB#: [id number]  cc: Heinz Knuckles. Britini Garcilazo, MD, <Dictator> Margarita Bobrowski E Jerry Clyne MD ELECTRONICALLY SIGNED 09/14/2012 9:05

## 2014-09-21 NOTE — H&P (Signed)
PATIENT NAME:  Carolyn Farley, Carolyn Farley MR#:  [id number] DATE OF BIRTH:  [birth year]  DATE OF ADMISSION:  09/09/2012  PRIMARY CARE PHYSICIAN:  Dr. Glori Bickers, [facility]  HISTORY OF PRESENT ILLNESS:  The patient is a 81 year old Caucasian female with history of hypertension, hyperlipidemia, Bell's palsy, history of macular degeneration, who presented to the hospital with a second fall in the past few days.  According to patient's daughter who is present during my interview patient was admitted to assisted living facility a few days ago.  She was walking to the door with using her walker on Tuesday, which was on 09/06/2012 and suddenly she fell down.  She was having significant pains in her right side.  She was brought to the Emergency Room for further evaluation.  In the Emergency Room, she had a right shoulder complete x-ray on 09/06/2012 which showed no definite fracture or dislocation as well as right hip complete x-ray which also revealed degenerative disease of right hip, but no acute fracture.  She was brought back to assisted living facility with pain medications, however apparently she was taken to the bathroom today and she was asked not to stand up and just call nursing staff, however she took liberty to standing herself and she got off toilet and again fell down.  She was brought again to the hospital with significant pains in her right hip as well as the right knee.  She underwent numerous radiologic evaluations this time on 09/09/2012.  She had a pelvis AP x-ray which showed moderate to severe degenerative disease of right hip.  Also right hip complete x-ray repeated which showed no hip fracture and then CT or MRI was recommended.  She underwent a CT scan of her cervical spine which showed no abnormalities and CT of head as well as MRI of her right hip which was unremarkable, but no definite fracture was noted.  Hospitalist services were contacted for admission as the patient has significant pain.   PAST MEDICAL  HISTORY:  Significant for history of right hip as well as right knee arthritis, has been getting progressively worse over the past one year, hypertension, hyperlipidemia, Bell's palsy, macular degeneration, history of sinus congestion with allergies, lower extremity pains very likely due to arthritis.  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  The patient's daughter is not sure what kind, but that was nonmalignant tumor.   ALLERGIES:  LIPITOR.   FAMILY HISTORY:  Negative for diabetes mellitus.  Multiple cancers in family.  The patient's daughter died at the age of 2 of breast cancer.   SOCIAL HISTORY:  The patient is widowed, has five children, lives in an assisted living facility now.  No smoking or alcohol abuse.   REVIEW OF SYSTEMS:  Difficult to obtain as patient has difficulty hearing.  Admits of having shortness of breath as well as feeling tired whenever she exerts herself.  Right hip as well as right knee pain for a while now, worsening over a period of a year.  Some nausea as well as dysuria symptoms.  Otherwise, denies fevers, chills, fatigue, weakness. EYES:  Troubles with vision, except as mentioned above.  She has macular degeneration.  EARS, NOSE, THROAT:  Denies any tinnitus, allergies, sinus tenderness recently.   LUNGS:  Denies any cough, wheezes, or shortness of breath at this moment.  Denies any chest pains, arrhythmias, having syncope.  GASTROINTESTINAL:  Denies any vomiting, abdominal pains.  Denies any constipation or diarrhea.  GENITOURINARY:  Denies any problems with urination, not sure about dysuria, but admits of having intermittent dysuria symptoms, not really sure now.  Admits of having abnormal pain whenever she is touched, however denied any abdominal pain prior to palpation.  Otherwise, admits of having right hip, right groin area pains as well as right knee pain.  MUSCULOSKELETAL:  Denies any cramping,  swelling.  NEUROLOGIC:  No numbness, epilepsy or tremor.  PSYCHIATRIC:  Denies anxiety.   PHYSICAL EXAMINATION:  VITAL SIGNS:  On arrival to the Emergency Room, temperature 99.1, pulse 70, respirations rate was 18, blood pressure 128/66, saturation was 96% on room air.  GENERAL:  This is a well-developed, well-nourished pale and sick, very uncomfortable, lady lying on the stretcher.  She is very uncomfortable.  She has her legs half bent in her knees as well as her hips and a pillow under her knees.  She is very uncomfortable and asking [place] to get her off that bed.  HEENT:  Her pupils are equal, reactive to light.  Extraocular movements intact.  No icterus, conjunctivitis.  Has poor hearing.  No pharyngeal erythema.  Mucosa is dry.  NECK:  No masses.  Supple, nontender.  Thyroid not enlarged.  No adenopathy.  No JVD or carotid bruits bilaterally.  Full range of motion.  LUNGS:  Clear to auscultation in all fields.  No rales, rhonchi or wheezing.  No labored inspirations, increased effort, dullness to percussion,  not in overt  respiratory distress.  CARDIOVASCULAR:  S1, S2 appreciated.  No murmurs noted.  PMI not lateralized.  Chest is nontender to palpation.  ABDOMEN:  The patient's abdomen is soft.  Tender diffusely mostly in the right lower quadrant as well as the suprapubic area.  The patient is however not comfortable all over her abdominal palpation, but no rebound or guarding was noted.  No hepatosplenomegaly or masses were noted. RECTAL:  Deferred. EXTREMITIES:  1+ pedal pulses.  Trace lower extremity edema around her ankles.  No calf tenderness or cyanosis.  The patient is in significant discomfort and pain whenever you touch her hip or knee or in general bones on that right side.  Not able to assess her muscular strength whatsoever, especially in her lower extremities due to significant discomfort.  No cyanosis.  Not able to assess for kyphosis.  Gait is not tested.  SKIN:  No rashes, lesions,  erythema, nodularity, induration.  It was warm and dry to palpation.  LYMPHATIC:  No adenopathy in the cervical region.  NEUROLOGIC:  Cranial nerves difficult to evaluate as the patient is poorly cooperative.  No significant dysarthria.  No aphasia.  The patient is alert.  She is not cooperative.  She is somewhat agitated and restless.    LABORATORY DATA:  BMP showed elevated BUN of 20, sodium 125, as compared to 129 of sodium on 09/06/2012.  At that time, patient's BUN was 18, otherwise BMP was unremarkable.  Liver enzymes were normal.  Cardiac enzymes, first set negative.  CBC, white blood cell count 6.6, hemoglobin 11.5, platelet count 305.  Urine sodium random study was 68.  Urinalysis showed yellow clear urine, negative for glucose, bilirubin, trace ketones was noted, specific gravity 1.009, pH was 6.0, negative for blood, protein, nitrites or leukocyte esterase, 3 red blood cells, less than 1 white blood cell, no bacteria was seen, 1 epithelial cell as well as mucus was present.  A few days ago, on 09/06/2012, the patient's urine looked yellow, hazy, negative for glucose, bilirubin or ketones,  specific gravity was 1.014, pH was 8.0, negative for blood, protein, nitrites, 2+ leukocyte esterase, 3 red blood cells, 58 white blood cells were seen, also 3 bacteria, 5 epithelial cells, however patient's urine cultures showed mixed bacterial organisms, results suggestive of contamination.   RADIOLOGIC STUDIES:  Chest, portable, single view, 09/09/2012, showed no acute cardiopulmonary disease.  Right hip complete x-ray showed no hip fracture.  Pelvis AP x-ray showed moderately to severe degenerative changes of right hip.  CT scan of patient's head without contrast 09/09/2012 revealed age related changes within the brain parenchyma.  No evidence of acute intracranial hemorrhage or evolving ischemic infarction.  Opacification of right mastoid air cells, which are new since January 2011 noted.  CT of cervical spine  without contrast 09/09/2012 showed no evidence of acute cervical spine fracture or dislocation, moderate degenerative disk change and facet joint change at multiple levels were found.  MRI of right hip without contrast 09/09/2012 revealed examination limited, no definite fracture, subtle focus of curvilinear low signal in superior aspect of right femoral neck which is seen on a single image only and felt to be artifactual.  There was edema within the right femoral head and neck as well as acetabulum which was nonspecific and may be related to degenerative changes.  Infectious or inflammatory arthropathies were not excluded.  Idiopathic transient osteoporosis of hip would be differential consideration as well.  Edema of gluteus medius muscle which may be related to muscle strain or neurogenic etiology according to radiologist.   ASSESSMENT AND PLAN: 1.  Right hip pain after fall.  Admit patient to the medical floor.  Pain medications at this time.  Also ortho consult and  physical therapy consult  when and if orthopedist allows it.  2.  Urinary tract infection.  We will have urine cultures, in and out cath.  We will start patient on Rocephin, seems to be improving.  Urinalysis seems to be improving however cultures are negative.   3.  Hyponatremia.  We will continue IV fluids.  We will discontinue HCTZ.  4.  Anemia.  Get guaiac.   TIME SPENT:  50 minutes.    ____________________________ Theodoro Grist, MD rv:ea D: 09/09/2012 21:15:46 ET T: 09/10/2012 01:06:25 ET JOB#: [id number]  cc: Theodoro Grist, MD, <Dictator> Outside Physician [place] MD ELECTRONICALLY SIGNED 10/14/2012 19:02

## 2014-09-21 NOTE — Consult Note (Signed)
Brief Consult Note: Diagnosis: rlq tenderness.   Patient was seen by consultant.   Consult note dictated.   Recommend further assessment or treatment.   Comments: unable to obtain Hx or ROS except from chart. PE shows minimal bilat LQ tenderness without guarding, rebound or percussion tenderness. Agree that CT may be of value if pain were to worsen but doubt exposure to contrast is warranted in pt with minimal tenderness without peritoneal findings.  Electronic Signatures: Florene Glen (MD)  (Signed 16-Apr-14 20:17)  Authored: Brief Consult Note   Last Updated: 16-Apr-14 20:17 by Florene Glen (MD)

## 2014-09-21 NOTE — Consult Note (Signed)
Impression:    81yo female w/ h/o osteoarthritis admitted with multiple falls who has fever.  Recommendations: 1)     She has had fever over the past few days.  It is difficult to get any history from her about whether she had been having fever prior to admission.  Her WBC is normal.  Her u/a and CXR do not show signs of infection. 2)     She has significant right sided abd pain with rebound and some guarding.  There is no bruising along the skin.  ? whether she could have diverticulitis vs trauma from her fall.  She had fallen on her right side.  3)     She had blood cultures drawn for the first time today.  4)     Will continue ceftriaxone until the BCx return. 5)     If her abd pain continues, would consider CT of the abd/pelvis.  This would be best with contrast. 6)     Will get venous dopplers tomorrow to rule out DVT.  Electronic Signatures: Rafal Archuleta MPH, Heinz Knuckles (MD)  (Signed on 15-Apr-14 17:01)  Authored  Last Updated: 15-Apr-14 17:01 by Maximilian Tallo MPH, Heinz Knuckles (MD)

## 2014-09-21 NOTE — Consult Note (Signed)
Brief Consult Note: Diagnosis: Right hip pain.   Patient was seen by consultant.   Recommend further assessment or treatment.   Orders entered.   Comments: Patient is a 81 y/o female with a history of two falls within the past week.  She has complained of pain in the right hip.  The daughter is at the bedside this AM.  Patient is sitting up in chair.  The daughter explains that she has arthritis throughout her body, but particularly affecting her right side and that she has numbness in all four extremities at baseline.  Patient has a history of previous spine surgery.  Patient is uncomfortable in the chair and complains of pain in her neck.  On exam, patient has no leg length discrepancy or change in lower extremity rotation.  There is no asymetry b/w the LE's.  She has intact skin.  She has palpable pedal pulses and intact motor function but deceased but symmetric sensation to light touch in both LE.  She has pain with movement of the right lower extremity.  I have reviewed the MRI of the right hip and plain films which demonstrate severe OA and edema on both sides of the right hip joint consistent with OA, but no definite hip fracture.  Pain in the right hip is likely related to the severe OA but also possibly lumbar spine.  She was unable to move well being reclined in the chair to examine her spine.  I recommend PT evaluation and plain films of her lumbar spine and C spine.  An MRI of the lumbar spine may be considered if the lumbar plain films suggest compression fracture or severe degenerative joint disease that may lead to lumbar or cervical disc herniations.  Electronic Signatures: [place] (MD)  (Signed 13-Apr-14 10:47)  Authored: Brief Consult Note   Last Updated: 13-Apr-14 10:47 by [place] (MD)
